# Patient Record
Sex: Female | Born: 1960
Health system: Southern US, Community
[De-identification: ages and names within clinical notes are randomized; demographics above are authoritative.]

## PROBLEM LIST (undated history)

## (undated) ENCOUNTER — Ambulatory Visit (HOSPITAL_BASED_OUTPATIENT_CLINIC_OR_DEPARTMENT_OTHER): Admission: EM | Payer: BC Managed Care – PPO | Source: Home / Self Care

## (undated) DIAGNOSIS — M2550 Pain in unspecified joint: Secondary | ICD-10-CM

## (undated) DIAGNOSIS — C801 Malignant (primary) neoplasm, unspecified: Secondary | ICD-10-CM

## (undated) DIAGNOSIS — J309 Allergic rhinitis, unspecified: Secondary | ICD-10-CM

## (undated) DIAGNOSIS — F419 Anxiety disorder, unspecified: Secondary | ICD-10-CM

## (undated) DIAGNOSIS — R7989 Other specified abnormal findings of blood chemistry: Secondary | ICD-10-CM

## (undated) DIAGNOSIS — R002 Palpitations: Secondary | ICD-10-CM

## (undated) DIAGNOSIS — K644 Residual hemorrhoidal skin tags: Secondary | ICD-10-CM

## (undated) DIAGNOSIS — G4700 Insomnia, unspecified: Secondary | ICD-10-CM

## (undated) DIAGNOSIS — E785 Hyperlipidemia, unspecified: Secondary | ICD-10-CM

## (undated) DIAGNOSIS — D649 Anemia, unspecified: Secondary | ICD-10-CM

## (undated) DIAGNOSIS — R748 Abnormal levels of other serum enzymes: Secondary | ICD-10-CM

## (undated) DIAGNOSIS — M810 Age-related osteoporosis without current pathological fracture: Secondary | ICD-10-CM

## (undated) HISTORY — DX: Palpitations: R00.2

## (undated) HISTORY — PX: UMBILICAL HERNIA REPAIR: SHX196

## (undated) HISTORY — DX: Pain in unspecified joint: M25.50

## (undated) HISTORY — PX: MASTECTOMY: SHX3

## (undated) HISTORY — PX: APPENDECTOMY: SHX54

## (undated) HISTORY — DX: Malignant (primary) neoplasm, unspecified: C80.1

## (undated) HISTORY — DX: Anemia, unspecified: D64.9

## (undated) HISTORY — PX: PARTIAL MASTECTOMY WITH AXILLARY SENTINEL LYMPH NODE BIOPSY: SHX6004

## (undated) HISTORY — DX: Age-related osteoporosis without current pathological fracture: M81.0

## (undated) HISTORY — DX: Abnormal levels of other serum enzymes: R74.8

## (undated) HISTORY — DX: Allergic rhinitis, unspecified: J30.9

## (undated) HISTORY — DX: Hyperlipidemia, unspecified: E78.5

## (undated) HISTORY — PX: TUBAL LIGATION: SHX77

## (undated) HISTORY — DX: Residual hemorrhoidal skin tags: K64.4

## (undated) HISTORY — DX: Insomnia, unspecified: G47.00

## (undated) HISTORY — PX: VAGINAL HYSTERECTOMY: SUR661

## (undated) HISTORY — DX: Anxiety disorder, unspecified: F41.9

## (undated) HISTORY — DX: Other specified abnormal findings of blood chemistry: R79.89

---

## 2007-07-05 ENCOUNTER — Encounter: Admission: RE | Admit: 2007-07-05 | Discharge: 2007-07-05 | Payer: Self-pay | Admitting: Obstetrics and Gynecology

## 2007-07-09 ENCOUNTER — Encounter (INDEPENDENT_AMBULATORY_CARE_PROVIDER_SITE_OTHER): Payer: Self-pay | Admitting: Diagnostic Radiology

## 2007-07-09 ENCOUNTER — Encounter: Admission: RE | Admit: 2007-07-09 | Discharge: 2007-07-09 | Payer: Self-pay | Admitting: Obstetrics and Gynecology

## 2007-07-25 ENCOUNTER — Encounter: Admission: RE | Admit: 2007-07-25 | Discharge: 2007-07-25 | Payer: Self-pay | Admitting: Vascular Surgery

## 2007-07-25 ENCOUNTER — Encounter (INDEPENDENT_AMBULATORY_CARE_PROVIDER_SITE_OTHER): Payer: Self-pay | Admitting: Diagnostic Radiology

## 2010-07-24 ENCOUNTER — Encounter: Payer: Self-pay | Admitting: Vascular Surgery

## 2011-10-24 HISTORY — PX: VAGINAL HYSTERECTOMY: SUR661

## 2015-03-01 ENCOUNTER — Other Ambulatory Visit: Payer: Self-pay

## 2015-03-01 HISTORY — PX: COLONOSCOPY: SHX174

## 2015-09-10 DIAGNOSIS — M8580 Other specified disorders of bone density and structure, unspecified site: Secondary | ICD-10-CM | POA: Diagnosis not present

## 2015-09-10 DIAGNOSIS — C50919 Malignant neoplasm of unspecified site of unspecified female breast: Secondary | ICD-10-CM | POA: Diagnosis not present

## 2015-09-10 DIAGNOSIS — Z79811 Long term (current) use of aromatase inhibitors: Secondary | ICD-10-CM | POA: Diagnosis not present

## 2015-10-08 ENCOUNTER — Encounter (HOSPITAL_COMMUNITY): Payer: Self-pay

## 2016-08-01 DIAGNOSIS — C50412 Malignant neoplasm of upper-outer quadrant of left female breast: Secondary | ICD-10-CM

## 2016-08-01 DIAGNOSIS — Z170 Estrogen receptor positive status [ER+]: Secondary | ICD-10-CM

## 2016-08-01 HISTORY — DX: Malignant neoplasm of upper-outer quadrant of left female breast: Z17.0

## 2016-08-01 HISTORY — DX: Estrogen receptor positive status (ER+): C50.412

## 2016-08-31 DIAGNOSIS — C801 Malignant (primary) neoplasm, unspecified: Secondary | ICD-10-CM | POA: Insufficient documentation

## 2016-09-07 DIAGNOSIS — Z853 Personal history of malignant neoplasm of breast: Secondary | ICD-10-CM | POA: Diagnosis not present

## 2016-09-07 DIAGNOSIS — Z170 Estrogen receptor positive status [ER+]: Secondary | ICD-10-CM

## 2016-09-27 ENCOUNTER — Encounter: Payer: Self-pay | Admitting: Student

## 2016-09-27 ENCOUNTER — Ambulatory Visit (INDEPENDENT_AMBULATORY_CARE_PROVIDER_SITE_OTHER): Payer: 59 | Admitting: Student

## 2016-09-27 DIAGNOSIS — M76821 Posterior tibial tendinitis, right leg: Secondary | ICD-10-CM | POA: Insufficient documentation

## 2016-09-27 HISTORY — DX: Posterior tibial tendinitis, right leg: M76.821

## 2016-09-27 NOTE — Progress Notes (Signed)
Katelyn Cruz - 56 y.o. female MRN [id number]  Date of birth: Apr 10, 1961  SUBJECTIVE:  Including CC & ROS.  CC: right foot pain  Presents with right foot pain that is been ongoing for the past few weeks. She is an avid runner and was noticing pain on her right foot when she was running mainly. Became worse over the past couple weeks. She has been resting it with some relief. She was seen at [facility] diagnosed her with posterior tibialis tendinitis. Updated x-rays which were negative. She is here to be fit for custom orthotics.  She is not currently training for any rays but she would like to get back into recreational running. She has not run for the past week since she last saw Dr. French Ana at [facility].    ROS: No unexpected weight loss, fever, chills, swelling, instability, muscle pain, numbness/tingling, redness, otherwise see HPI   PMHx - Updated and reviewed.  Contributory factors include: Negative PSHx - Updated and reviewed.  Contributory factors include:  Negative FHx - Updated and reviewed.  Contributory factors include:  Negative Social Hx - Updated and reviewed. Contributory factors include: Negative Medications - reviewed   DATA REVIEWED: 3 view right foot x-ray which was negative done on 09/19/2016  PHYSICAL EXAM:  VS: BP:111/71  HR: bpm  TEMP: ( )  RESP:   HT:5\' 2"  (157.5 cm)   WT:110 lb (49.9 kg)  BMI:20.2 PHYSICAL EXAM: Gen: NAD, alert, cooperative with exam, well-appearing HEENT: clear conjunctiva,  CV:  no edema, capillary refill brisk, normal rate Resp: non-labored Skin: no rashes, normal turgor  Neuro: no gross deficits.  Psych:  alert and oriented  Ankle & Foot: No visible swelling, ecchymosis, erythema, ulcers, calluses, blister Arch: Normal w/o pes cavus or planus, but fallen arches upon standing  Right posterior tibialis tenderness to palpation none over left. Tenderness to palpation along the medial tibia 6 cm superior to distal end b/l bunion  formation Achilles tendon without nodules or tenderness No swelling of retrocalcaneal bursa No pain at MT heads No pain at base of 5th MT; No tenderness over cuboid; No tenderness over N spot or navicular prominence No sign of peroneal tendon subluxations or tenderness to palpation Full in plantarflexion, dorsiflexion, inversion, and eversion of the foot; flexion and extension of the toes Strength: 5/5 in all directions. Sensation: intact Vascular: intact w/ dorsalis pedis & posterior tibialis pulses 2+ Stable lateral and medial ligaments; Negative Anterior drawer test  Gait  Overall balanced gait with appropriate base width and stride length  Foot: supination R>L; No in-toeing or out-toeing; No foot slap or high steppage gait  Knee: extension and flexion adequate w/o genu varus or valgus  Hip: No circumduction or contralateral drop  Trunk: Neutral w/o lean  Shoe evaluation: wearing of b/l 5th ray and 1st MTP     ASSESSMENT & PLAN:   Tibialis posterior tendinitis, right Did custom orthotics to help with her tendinitis. Arch straps to help with swelling and arch support. She'll follow-up if she needs further padding for her orthotics. She'll gradually return to running after she has had a period of rest.  Patient was fitted for a standard, cushioned, semi-rigid orthotic. The orthotic was heated and afterward the patient stood on the orthotic blank positioned on the orthotic stand. The patient was positioned in subtalar neutral position and 10 degrees of ankle dorsiflexion in a weight bearing stance. After completion of molding, a stable base was applied to the orthotic blank. The blank  was ground to a stable position for weight bearing. Size: 6 Base: Blue EVA Additional Posting and Padding: None The patient ambulated these, and they were very comfortable.  I spent 45 minutes with this patient, greater than 50% was face-to-face time counseling regarding the below  diagnosis.

## 2016-09-27 NOTE — Assessment & Plan Note (Addendum)
Did custom orthotics to help with her tendinitis. Arch straps to help with swelling and arch support. She'll follow-up if she needs further padding for her orthotics. She'll gradually return to running after she has had a period of rest.

## 2017-09-07 DIAGNOSIS — Z853 Personal history of malignant neoplasm of breast: Secondary | ICD-10-CM | POA: Diagnosis not present

## 2017-12-25 ENCOUNTER — Encounter: Payer: Self-pay | Admitting: Gastroenterology

## 2017-12-31 ENCOUNTER — Encounter: Payer: Self-pay | Admitting: Gastroenterology

## 2018-02-14 ENCOUNTER — Ambulatory Visit (AMBULATORY_SURGERY_CENTER): Payer: Self-pay | Admitting: *Deleted

## 2018-02-14 VITALS — Ht 62.0 in | Wt 111.4 lb

## 2018-02-14 DIAGNOSIS — Z86010 Personal history of colonic polyps: Secondary | ICD-10-CM

## 2018-02-14 MED ORDER — SUPREP BOWEL PREP KIT 17.5-3.13-1.6 GM/177ML PO SOLN: 1.0000 | Freq: Once | ORAL | 0 refills | Status: AC

## 2018-02-14 NOTE — Progress Notes (Signed)
No egg or soy allergy known to patient  No issues with past sedation with any surgeries  or procedures, no intubation problems  No diet pills per patient No home 02 use per patient  No blood thinners per patient  Pt denies issues with constipation  No A fib or A flutter  EMMI video offered

## 2018-02-15 ENCOUNTER — Telehealth: Payer: Self-pay | Admitting: Gastroenterology

## 2018-02-15 ENCOUNTER — Encounter: Payer: Self-pay | Admitting: Gastroenterology

## 2018-02-18 MED ORDER — SOD PICOSULFATE-MAG OX-CIT ACD 10-3.5-12 MG-GM -GM/160ML PO SOLN: 1.0000 | ORAL | 0 refills | Status: DC

## 2018-02-18 NOTE — Telephone Encounter (Signed)
Pt states the pharmacy spoke with someone on Friday and has Clenpiq ready for her to pick up- I did resend Clenpiq to the pharmacy in case-- Lelan Pons PV

## 2018-02-22 ENCOUNTER — Ambulatory Visit (AMBULATORY_SURGERY_CENTER): Payer: 59 | Admitting: Gastroenterology

## 2018-02-22 ENCOUNTER — Encounter: Payer: Self-pay | Admitting: Gastroenterology

## 2018-02-22 VITALS — BP 122/55 | HR 63 | Temp 97.7°F | Resp 14 | Ht 62.0 in | Wt 111.0 lb

## 2018-02-22 DIAGNOSIS — Z86010 Personal history of colonic polyps: Secondary | ICD-10-CM

## 2018-02-22 DIAGNOSIS — R197 Diarrhea, unspecified: Secondary | ICD-10-CM

## 2018-02-22 MED ORDER — SODIUM CHLORIDE 0.9 % IV SOLN: 500.0000 mL | Freq: Once | INTRAVENOUS | Status: DC

## 2018-02-22 NOTE — Op Note (Signed)
[facility] Patient Name: Katelyn Cruz Procedure Date: 02/22/2018 8:59 AM MRN: [id number] Endoscopist: Jackquline Denmark , MD Age: 57 Referring MD:  Date of Birth: 14-Jan-1961 Gender: Female Account #: [id number] Procedure:                Colonoscopy Indications:              High risk colon cancer surveillance: Personal                            history of colonic polyps, History of diarrhea Medicines:                Monitored Anesthesia Care Procedure:                Pre-Anesthesia Assessment:                           - Prior to the procedure, a History and Physical                            was performed, and patient medications and                            allergies were reviewed. The patient's tolerance of                            previous anesthesia was also reviewed. The risks                            and benefits of the procedure and the sedation                            options and risks were discussed with the patient.                            All questions were answered, and informed consent                            was obtained. Prior Anticoagulants: The patient has                            taken no previous anticoagulant or antiplatelet                            agents. ASA Grade Assessment: I - A normal, healthy                            patient. After reviewing the risks and benefits,                            the patient was deemed in satisfactory condition to                            undergo the procedure.  After obtaining informed consent, the colonoscope                            was passed under direct vision. Throughout the                            procedure, the patient's blood pressure, pulse, and                            oxygen saturations were monitored continuously. The                            Colonoscope was introduced through the anus and                            advanced to the 2 cm into the  ileum. The                            colonoscopy was performed without difficulty. The                            patient tolerated the procedure well. The quality                            of the bowel preparation was adequate to identify                            polyps 6 mm and larger in size. Some retained stool                            and solid vegetable material. Overall, 90-95% or                            colonic mucosa was visualized satisfactorily. Scope In: 9:08:48 AM Scope Out: 9:23:00 AM Scope Withdrawal Time: 0 hours 7 minutes 25 seconds  Total Procedure Duration: 0 hours 14 minutes 12 seconds  Findings:                 Non-bleeding internal hemorrhoids were found. The                            hemorrhoids were small. Biopsies for histology were                            taken with a cold forceps from the entire colon for                            evaluation of microscopic colitis.                           The exam was otherwise without abnormality on                            direct and retroflexion views.  Complications:            No immediate complications. Estimated Blood Loss:     Estimated blood loss: none. Impression:               - Non-bleeding internal hemorrhoids. Biopsied.                           - The examination was otherwise normal on direct                            and retroflexion views. Recommendation:           - Patient has a contact number available for                            emergencies. The signs and symptoms of potential                            delayed complications were discussed with the                            patient. Return to normal activities tomorrow.                            Written discharge instructions were provided to the                            patient.                           - Resume previous diet.                           - Continue present medications.                           - Await pathology  results.                           - Repeat colonoscopy in 5 years for surveillance.                           - Return to GI clinic PRN. Jackquline Denmark, MD 02/22/2018 9:28:27 AM This report has been signed electronically.

## 2018-02-22 NOTE — Progress Notes (Signed)
Pt's states no medical or surgical changes since previsit or office visit. 

## 2018-02-22 NOTE — Progress Notes (Signed)
Called to room to assist during endoscopic procedure.  Patient ID and intended procedure confirmed with present staff. Received instructions for my participation in the procedure from the performing physician.  

## 2018-02-22 NOTE — Patient Instructions (Signed)
Please read handout on Hemorrhoids. Continue present medications. Return to GI clinic as needed.     YOU HAD AN ENDOSCOPIC PROCEDURE TODAY AT [facility]:   Refer to the procedure report that was given to you for any specific questions about what was found during the examination.  If the procedure report does not answer your questions, please call your gastroenterologist to clarify.  If you requested that your care partner not be given the details of your procedure findings, then the procedure report has been included in a sealed envelope for you to review at your convenience later.  YOU SHOULD EXPECT: Some feelings of bloating in the abdomen. Passage of more gas than usual.  Walking can help get rid of the air that was put into your GI tract during the procedure and reduce the bloating. If you had a lower endoscopy (such as a colonoscopy or flexible sigmoidoscopy) you may notice spotting of blood in your stool or on the toilet paper. If you underwent a bowel prep for your procedure, you may not have a normal bowel movement for a few days.  Please Note:  You might notice some irritation and congestion in your nose or some drainage.  This is from the oxygen used during your procedure.  There is no need for concern and it should clear up in a day or so.  SYMPTOMS TO REPORT IMMEDIATELY:   Following lower endoscopy (colonoscopy or flexible sigmoidoscopy):  Excessive amounts of blood in the stool  Significant tenderness or worsening of abdominal pains  Swelling of the abdomen that is new, acute  Fever of 100F or higher    For urgent or emergent issues, a gastroenterologist can be reached at any hour by calling [phone].   DIET:  We do recommend a small meal at first, but then you may proceed to your regular diet.  Drink plenty of fluids but you should avoid alcoholic beverages for 24 hours.  ACTIVITY:  You should plan to take it easy for the rest of today and you  should NOT DRIVE or use heavy machinery until tomorrow (because of the sedation medicines used during the test).    FOLLOW UP: Our staff will call the number listed on your records the next business day following your procedure to check on you and address any questions or concerns that you may have regarding the information given to you following your procedure. If we do not reach you, we will leave a message.  However, if you are feeling well and you are not experiencing any problems, there is no need to return our call.  We will assume that you have returned to your regular daily activities without incident.  If any biopsies were taken you will be contacted by phone or by letter within the next 1-3 weeks.  Please call us at [phone] if you have not heard about the biopsies in 3 weeks.    SIGNATURES/CONFIDENTIALITY: You and/or your care partner have signed paperwork which will be entered into your electronic medical record.  These signatures attest to the fact that that the information above on your After Visit Summary has been reviewed and is understood.  Full responsibility of the confidentiality of this discharge information lies with you and/or your care-partner.

## 2018-02-22 NOTE — Progress Notes (Signed)
Report given to PACU, vss 

## 2018-02-25 ENCOUNTER — Telehealth: Payer: Self-pay

## 2018-02-25 NOTE — Telephone Encounter (Signed)
  Follow up Call-  Call back number 02/22/2018  Post procedure Call Back phone  # [phone]  Permission to leave phone message Yes  Some recent data might be hidden     Patient questions:  Do you have a fever, pain , or abdominal swelling? No. Pain Score  0 *  Have you tolerated food without any problems? Yes.    Have you been able to return to your normal activities? Yes.    Do you have any questions about your discharge instructions: Diet   No. Medications  No. Follow up visit  No.  Do you have questions or concerns about your Care? No.  Actions: * If pain score is 4 or above: No action needed, pain <4.

## 2018-03-07 ENCOUNTER — Telehealth: Payer: Self-pay | Admitting: Gastroenterology

## 2018-03-07 ENCOUNTER — Encounter: Payer: Self-pay | Admitting: Gastroenterology

## 2018-03-07 NOTE — Telephone Encounter (Signed)
Pt calling for biopsy results, please advise.

## 2018-03-11 ENCOUNTER — Telehealth: Payer: Self-pay | Admitting: Gastroenterology

## 2018-03-11 NOTE — Telephone Encounter (Signed)
Results letter discussed with pt and she is aware.

## 2018-04-09 ENCOUNTER — Telehealth: Payer: Self-pay | Admitting: Gastroenterology

## 2018-04-09 NOTE — Telephone Encounter (Signed)
Patient requesting to speak with the nurse regarding results from procedure on 8.23.19. Patient states she has a couple questions about the results.

## 2018-04-09 NOTE — Telephone Encounter (Signed)
Pt states the symptoms she is having are just like the ones she had 3 years ago when she was told she had microscopic colitis. This colon bx was negative for microscopic colitis. She wants to know if perhaps the biopsy just did not contain it, could it be elsewhere in the colon. She is still having multiple diarrhea stools/day. Reports she has already had 6 stools this morning. She has cut out all supplements and wonders if starting the medication she took previously might help. Please advise.

## 2018-04-11 NOTE — Telephone Encounter (Signed)
Can take Imodium AD 1 twice daily Patient to call if she still has diarrhea Do not want to give empiric budesonide at this time Biopsies are fairly good and ruling out microscopic colitis

## 2018-04-11 NOTE — Telephone Encounter (Signed)
Spoke with pt and she is aware.

## 2018-04-15 ENCOUNTER — Telehealth: Payer: Self-pay | Admitting: Gastroenterology

## 2018-04-15 MED ORDER — CHOLESTYRAMINE 4 G PO PACK: 4.0000 g | PACK | Freq: Two times a day (BID) | ORAL | 2 refills | Status: DC

## 2018-04-15 NOTE — Telephone Encounter (Signed)
Lets do cholestyramine 4 g p.o. twice daily, 2 hours before or after rest of the medications. #30 with 2 refills Let us know in 2 weeks

## 2018-04-15 NOTE — Telephone Encounter (Signed)
Pt called stating that her pharmacy does not have cholestyramine available, pt wants prescription [facility] to [facility] [address] in [place].

## 2018-04-15 NOTE — Telephone Encounter (Signed)
Spoke with pt and she is aware, script sent to pharmacy. 

## 2018-04-15 NOTE — Telephone Encounter (Signed)
Sent refills to pharmacy that patient requested.

## 2018-04-15 NOTE — Telephone Encounter (Signed)
Pt states she has been taking Imodium BID but it works hit or miss. She has done some research and wants to know if Dr. Lyndel Safe would prescribe cholestyramine for her to see if this works. Please advise.

## 2018-07-22 DIAGNOSIS — Z1231 Encounter for screening mammogram for malignant neoplasm of breast: Secondary | ICD-10-CM | POA: Diagnosis not present

## 2018-07-22 DIAGNOSIS — M8588 Other specified disorders of bone density and structure, other site: Secondary | ICD-10-CM | POA: Diagnosis not present

## 2018-07-22 DIAGNOSIS — M8589 Other specified disorders of bone density and structure, multiple sites: Secondary | ICD-10-CM | POA: Diagnosis not present

## 2018-07-22 DIAGNOSIS — M47816 Spondylosis without myelopathy or radiculopathy, lumbar region: Secondary | ICD-10-CM | POA: Diagnosis not present

## 2018-08-04 DIAGNOSIS — K449 Diaphragmatic hernia without obstruction or gangrene: Secondary | ICD-10-CM | POA: Diagnosis not present

## 2018-08-04 DIAGNOSIS — R1013 Epigastric pain: Secondary | ICD-10-CM | POA: Diagnosis not present

## 2018-08-04 DIAGNOSIS — K8020 Calculus of gallbladder without cholecystitis without obstruction: Secondary | ICD-10-CM | POA: Diagnosis not present

## 2018-08-04 DIAGNOSIS — Z79899 Other long term (current) drug therapy: Secondary | ICD-10-CM | POA: Diagnosis not present

## 2018-08-04 DIAGNOSIS — R079 Chest pain, unspecified: Secondary | ICD-10-CM | POA: Diagnosis not present

## 2018-08-05 DIAGNOSIS — K449 Diaphragmatic hernia without obstruction or gangrene: Secondary | ICD-10-CM | POA: Diagnosis not present

## 2018-08-05 DIAGNOSIS — K8020 Calculus of gallbladder without cholecystitis without obstruction: Secondary | ICD-10-CM | POA: Diagnosis not present

## 2018-08-21 DIAGNOSIS — K529 Noninfective gastroenteritis and colitis, unspecified: Secondary | ICD-10-CM | POA: Diagnosis not present

## 2018-08-21 DIAGNOSIS — K8020 Calculus of gallbladder without cholecystitis without obstruction: Secondary | ICD-10-CM | POA: Diagnosis not present

## 2018-08-27 DIAGNOSIS — R1013 Epigastric pain: Secondary | ICD-10-CM

## 2018-08-27 DIAGNOSIS — K8010 Calculus of gallbladder with chronic cholecystitis without obstruction: Secondary | ICD-10-CM

## 2018-08-27 DIAGNOSIS — R945 Abnormal results of liver function studies: Secondary | ICD-10-CM | POA: Diagnosis not present

## 2018-08-27 DIAGNOSIS — R7989 Other specified abnormal findings of blood chemistry: Secondary | ICD-10-CM | POA: Insufficient documentation

## 2018-08-27 HISTORY — DX: Calculus of gallbladder with chronic cholecystitis without obstruction: K80.10

## 2018-08-27 HISTORY — DX: Epigastric pain: R10.13

## 2018-08-27 HISTORY — DX: Other specified abnormal findings of blood chemistry: R79.89

## 2018-10-24 DIAGNOSIS — F329 Major depressive disorder, single episode, unspecified: Secondary | ICD-10-CM | POA: Diagnosis not present

## 2018-10-24 DIAGNOSIS — G4700 Insomnia, unspecified: Secondary | ICD-10-CM | POA: Diagnosis not present

## 2018-10-24 DIAGNOSIS — M810 Age-related osteoporosis without current pathological fracture: Secondary | ICD-10-CM | POA: Diagnosis not present

## 2018-10-24 DIAGNOSIS — R748 Abnormal levels of other serum enzymes: Secondary | ICD-10-CM | POA: Diagnosis not present

## 2018-10-29 DIAGNOSIS — R1013 Epigastric pain: Secondary | ICD-10-CM | POA: Diagnosis not present

## 2018-10-29 DIAGNOSIS — R7989 Other specified abnormal findings of blood chemistry: Secondary | ICD-10-CM | POA: Diagnosis not present

## 2018-10-29 DIAGNOSIS — K8010 Calculus of gallbladder with chronic cholecystitis without obstruction: Secondary | ICD-10-CM | POA: Diagnosis not present

## 2018-11-01 HISTORY — PX: CHOLECYSTECTOMY: SHX55

## 2018-11-06 DIAGNOSIS — K915 Postcholecystectomy syndrome: Secondary | ICD-10-CM | POA: Diagnosis not present

## 2018-11-06 DIAGNOSIS — K8010 Calculus of gallbladder with chronic cholecystitis without obstruction: Secondary | ICD-10-CM | POA: Diagnosis not present

## 2018-11-06 DIAGNOSIS — R7989 Other specified abnormal findings of blood chemistry: Secondary | ICD-10-CM | POA: Diagnosis not present

## 2018-11-06 DIAGNOSIS — J309 Allergic rhinitis, unspecified: Secondary | ICD-10-CM | POA: Diagnosis not present

## 2018-11-06 DIAGNOSIS — K810 Acute cholecystitis: Secondary | ICD-10-CM | POA: Diagnosis not present

## 2018-11-12 DIAGNOSIS — Z09 Encounter for follow-up examination after completed treatment for conditions other than malignant neoplasm: Secondary | ICD-10-CM

## 2018-11-12 HISTORY — DX: Encounter for follow-up examination after completed treatment for conditions other than malignant neoplasm: Z09

## 2018-11-19 DIAGNOSIS — L814 Other melanin hyperpigmentation: Secondary | ICD-10-CM | POA: Diagnosis not present

## 2018-11-19 DIAGNOSIS — D2239 Melanocytic nevi of other parts of face: Secondary | ICD-10-CM | POA: Diagnosis not present

## 2018-11-19 DIAGNOSIS — L821 Other seborrheic keratosis: Secondary | ICD-10-CM | POA: Diagnosis not present

## 2018-11-19 DIAGNOSIS — D225 Melanocytic nevi of trunk: Secondary | ICD-10-CM | POA: Diagnosis not present

## 2018-12-06 DIAGNOSIS — M25551 Pain in right hip: Secondary | ICD-10-CM | POA: Diagnosis not present

## 2018-12-12 DIAGNOSIS — M25551 Pain in right hip: Secondary | ICD-10-CM | POA: Diagnosis not present

## 2019-03-25 DIAGNOSIS — R197 Diarrhea, unspecified: Secondary | ICD-10-CM | POA: Diagnosis not present

## 2019-03-25 DIAGNOSIS — G4700 Insomnia, unspecified: Secondary | ICD-10-CM | POA: Diagnosis not present

## 2019-03-25 DIAGNOSIS — E559 Vitamin D deficiency, unspecified: Secondary | ICD-10-CM | POA: Diagnosis not present

## 2019-03-25 DIAGNOSIS — R748 Abnormal levels of other serum enzymes: Secondary | ICD-10-CM | POA: Diagnosis not present

## 2019-06-23 DIAGNOSIS — R197 Diarrhea, unspecified: Secondary | ICD-10-CM | POA: Diagnosis not present

## 2019-06-30 DIAGNOSIS — L299 Pruritus, unspecified: Secondary | ICD-10-CM | POA: Diagnosis not present

## 2019-06-30 DIAGNOSIS — L209 Atopic dermatitis, unspecified: Secondary | ICD-10-CM | POA: Diagnosis not present

## 2019-07-08 ENCOUNTER — Telehealth (INDEPENDENT_AMBULATORY_CARE_PROVIDER_SITE_OTHER): Payer: BC Managed Care – PPO | Admitting: Gastroenterology

## 2019-07-08 ENCOUNTER — Other Ambulatory Visit: Payer: Self-pay

## 2019-07-08 VITALS — Ht 62.0 in | Wt 105.0 lb

## 2019-07-08 DIAGNOSIS — Z681 Body mass index (BMI) 19 or less, adult: Secondary | ICD-10-CM | POA: Diagnosis not present

## 2019-07-08 DIAGNOSIS — R634 Abnormal weight loss: Secondary | ICD-10-CM | POA: Diagnosis not present

## 2019-07-08 DIAGNOSIS — R197 Diarrhea, unspecified: Secondary | ICD-10-CM | POA: Diagnosis not present

## 2019-07-08 NOTE — Progress Notes (Signed)
Chief Complaint:   Referring Provider:  Melony Overly, MD      ASSESSMENT AND PLAN;   #1.  Diarrhea.  Likely due to IBS, element of post-chole diarrhea. R/O other causes. Neg colon with random colonic bx for microscopic colitis 01/2018.  However, previously in 2016, biopsies were positive for microscopic colitis.  Failed cholestyamine and equalactin. Failed lomotil. Neg C. Diff.  #2.  Weight loss  Plan: -Obtain previous blood tests from Orlinda Blalock FNP especially TSH -Stool studies for GI Pathogen (including giardia), fecal elastase, fat and Calprotectin.  Can be done at [facility]. -Stop all meds except calcium/vitamin D.  Add 1 medicine at a time, starting with digestive enzymes with each meals in 1 week. -Call us in 2 weeks.  Encouraged her to keep a diary. -FU 6 weeks.  If everything fails, would give her a trial of Lialda (has previous history of microscopic colitis in 2016, did very well with Lialda) -Check Wt every week and record. If continued wt loss, will proceed with CT abdo/pelvis with PO/IV contrast.  -Continue gluten-free diet for now.    HPI:    Katelyn Cruz is a 59 y.o. female   Has been having diarrhea since Aug 2019.  Was given trial of gluten-free diet which helped her a lot.  Thereafter underwent laparoscopic cholecystectomy in May 2020.  The diarrhea did get worse.  She was given trial of Equalactin, cholestyramine, Lomotil and Imodium without any significant relief.  Describes diarrhea as loose bowel movements, " oatmeal consistency" 5-7/day.  Did have some nocturnal symptoms which are better now.  She does feel somewhat better over the last 2 to 3 weeks.  Denies having any significant abdominal pain but does get bloating.  Had negative stool studies including C. difficile per patient.  We do not have any records currently.  Also tried lactose-free diet which helped a little.  Has been using digestive enzymes with each meals which helps a little  as well.  Runs a lot  Lost wt 5-6lb since may 2020  No melena or hematochezia.  No fever chills or night sweats.  No nausea, vomiting, heartburn, regurgitation odynophagia or dysphagia.   Past Medical History:  Diagnosis Date  . Allergic rhinitis   . Anxiety   . Arthralgia of multiple sites   . Cancer [facility])    Left Breast cancer, 2010  . Elevated liver enzymes   . External hemorrhoids   . Hyperlipidemia   . Insomnia   . Low vitamin D level   . Osteoporosis   . Palpitations     Past Surgical History:  Procedure Laterality Date  . APPENDECTOMY    . CHOLECYSTECTOMY  11/2018   [facility]. Dr Noberto Retort  . COLONOSCOPY  03/01/2015   Colonic polyps status post polypectomy. Internal and external hemorrhoids  . PARTIAL MASTECTOMY WITH AXILLARY SENTINEL LYMPH NODE BIOPSY    . TUBAL LIGATION    . VAGINAL HYSTERECTOMY      Family History  Problem Relation Age of Onset  . Colon cancer Neg Hx   . Colon polyps Neg Hx   . Esophageal cancer Neg Hx   . Stomach cancer Neg Hx   . Rectal cancer Neg Hx   . Pancreatic cancer Neg Hx     Social History   Tobacco Use  . Smoking status: Never Smoker  . Smokeless tobacco: Never Used  Substance Use Topics  . Alcohol use: Not Currently  . Drug use: Never  Current Outpatient Medications  Medication Sig Dispense Refill  . alendronate (FOSAMAX) 70 MG tablet TAKE 1 TABLET BY MOUTH ONCE (1) A WEEK  0  . Cholecalciferol (VITAMIN D PO) Take 1 tablet by mouth daily.     . Coenzyme Q10 200 MG TABS Take 1 tablet by mouth daily.    . NON FORMULARY Tumeric Curcumin 500mg  with 5 mg Bioperine    . Nutritional Supplements (JUICE PLUS FIBRE PO) Take 4 tablets by mouth daily.     Marland Kitchen OVER THE COUNTER MEDICATION daily. Vit d plus K2-1 drop    . UNABLE TO FIND 1 tablet 2 (two) times daily. calcuim carbonate 800 mg plus d3    . zaleplon (SONATA) 5 MG capsule TAKE 1 CAPSULE BY MOUTH AS NEEDED FOR SLEEP     No current facility-administered  medications for this visit.    No Known Allergies  Review of Systems:  Constitutional: Denies fever, chills, diaphoresis, appetite change and fatigue.  HEENT: Denies photophobia, eye pain, redness, hearing loss, ear pain, congestion, sore throat, rhinorrhea, sneezing, mouth sores, neck pain, neck stiffness and tinnitus.   Respiratory: Denies SOB, DOE, cough, chest tightness,  and wheezing.   Cardiovascular: Denies chest pain, palpitations and leg swelling.  Genitourinary: Denies dysuria, urgency, frequency, hematuria, flank pain and difficulty urinating.  Musculoskeletal: Denies myalgias, back pain, joint swelling, arthralgias and gait problem.  Skin: No rash.  Neurological: Denies dizziness, seizures, syncope, weakness, light-headedness, numbness and headaches.  Hematological: Denies adenopathy. Easy bruising, personal or family bleeding history  Psychiatric/Behavioral: Has some anxiety, no depression.     Physical Exam:    Ht 5\' 2"  (1.575 m)   Wt 105 lb (47.6 kg)   BMI 19.20 kg/m  Wt Readings from Last 3 Encounters:  07/08/19 105 lb (47.6 kg)  02/22/18 111 lb (50.3 kg)  02/14/18 111 lb 6.4 oz (50.5 kg)  Televisit.  I connected with  Katelyn Cruz on 07/08/19 by a video enabled telemedicine application and verified that I am speaking with the correct person using two identifiers.   I discussed the limitations of evaluation and management by telemedicine. The patient expressed understanding and agreed to proceed.  Time spent on coordination of care/phone call: 25 min   Carmell Austria, MD 07/08/2019, 10:45 AM  Cc: Melony Overly, MD

## 2019-07-08 NOTE — Patient Instructions (Addendum)
If you are age 58 or older, your body mass index should be between 23-30. Your Body mass index is 19.2 kg/m. If this is out of the aforementioned range listed, please consider follow up with your Primary Care Provider.  If you are age 20 or younger, your body mass index should be between 19-25. Your Body mass index is 19.2 kg/m. If this is out of the aformentioned range listed, please consider follow up with your Primary Care Provider.   Please go to the lab at [facility] ([place] You will need to go to level "B", you do not need an appointment for this. Hours available are 7:30 am - 4:30 pm.   Stop all meds except calcium/vitamin D.  Add 1 medicine at a time, starting with digestive enzymes with each meals in 1 week.  Keep a food diary.  Please call Dr. Leland Her nurse Karl Pock, RN)  in 2 weeks at [phone]  to let her now how you are doing.   Check weight and record every week.   Follow up in 6 weeks.   Thank you,  Dr. Jackquline Denmark

## 2019-07-11 ENCOUNTER — Telehealth: Payer: Self-pay | Admitting: Gastroenterology

## 2019-07-11 NOTE — Telephone Encounter (Signed)
Pt called inquiring about some stool studies that Dr. Lyndel Safe wants pt to have done. Pt states that Dr. Lyndel Safe told her that somebody from the office would call her to coordinate having tests in [place] where she lives but she has not heard from anyone. Pls call her.

## 2019-07-11 NOTE — Telephone Encounter (Signed)
Katelyn Cruz,  Please arrange for this patient to have the labs at [facility] in [place] please-per the patient's request

## 2019-07-15 NOTE — Telephone Encounter (Signed)
Patient has requested to have orders faxed to [facility]. I have faxed orders to [facility].

## 2019-07-15 NOTE — Telephone Encounter (Signed)
I have called patient and left message for her to return my call.  

## 2019-07-21 ENCOUNTER — Encounter: Payer: Self-pay | Admitting: Gastroenterology

## 2019-07-21 DIAGNOSIS — R197 Diarrhea, unspecified: Secondary | ICD-10-CM | POA: Diagnosis not present

## 2019-07-21 LAB — POCT STOOL FOR WBC

## 2019-07-24 DIAGNOSIS — Z1231 Encounter for screening mammogram for malignant neoplasm of breast: Secondary | ICD-10-CM | POA: Diagnosis not present

## 2019-07-28 NOTE — Telephone Encounter (Signed)
Patient is returning your call.  

## 2019-07-29 DIAGNOSIS — G4700 Insomnia, unspecified: Secondary | ICD-10-CM | POA: Diagnosis not present

## 2019-07-29 DIAGNOSIS — F419 Anxiety disorder, unspecified: Secondary | ICD-10-CM | POA: Diagnosis not present

## 2019-08-04 ENCOUNTER — Telehealth: Payer: Self-pay | Admitting: Gastroenterology

## 2019-08-04 NOTE — Telephone Encounter (Signed)
Patient returned call to the office-patient informed that the results would have to be requested from [facility] and then Dr. Lyndel Safe would review them and then she would receive a return phone call with an update;  Larena Glassman, Please request these results and place on Dr. Steve Rattler desk for review  Dr. Adonis Brook review results and advise on patient's plan of care

## 2019-08-04 NOTE — Telephone Encounter (Signed)
Dr. Lyndel Safe reviewed these labs and they were normal. Patient was notified. Patient is still complaining of diarrhea and wants to know what she needs to do next?

## 2019-08-05 NOTE — Telephone Encounter (Signed)
I have reviewed her medications Lets stop all medications including Juice Plus x 2 weeks. Can use Imodium AD 1 tablet p.o. twice daily over-the-counter. Let us know how she is in 2 weeks.  Then we can start adding 1 at a time  RG

## 2019-08-05 NOTE — Telephone Encounter (Signed)
Patient says she did stop all her medications with no relief.

## 2019-08-06 DIAGNOSIS — R928 Other abnormal and inconclusive findings on diagnostic imaging of breast: Secondary | ICD-10-CM | POA: Diagnosis not present

## 2019-08-06 DIAGNOSIS — R922 Inconclusive mammogram: Secondary | ICD-10-CM | POA: Diagnosis not present

## 2019-08-06 MED ORDER — MESALAMINE 1.2 G PO TBEC: 2.4000 g | DELAYED_RELEASE_TABLET | Freq: Every day | ORAL | 0 refills | Status: DC

## 2019-08-06 NOTE — Telephone Encounter (Signed)
She did very well with Lialda previously (previous biopsies did show microscopic colitis)  Lets try Lialda 2.4 g p.o. once a day or mesalamine 2.4 g p.o. once a day x 4 weeks. I think she will get better Follow-up in 4 weeks with me or Colleen.  In person Also, can be get stool studies results and scan into the chart RG

## 2019-08-06 NOTE — Telephone Encounter (Signed)
I have sent prescription in to patients pharmacy. Patient has requested that her follow up visit be virtual since she works out of town but appointment has been made.

## 2019-09-01 ENCOUNTER — Encounter: Payer: Self-pay | Admitting: Gastroenterology

## 2019-09-01 ENCOUNTER — Other Ambulatory Visit: Payer: Self-pay

## 2019-09-01 ENCOUNTER — Telehealth (INDEPENDENT_AMBULATORY_CARE_PROVIDER_SITE_OTHER): Payer: BC Managed Care – PPO | Admitting: Gastroenterology

## 2019-09-01 VITALS — Ht 62.0 in | Wt 108.0 lb

## 2019-09-01 DIAGNOSIS — R197 Diarrhea, unspecified: Secondary | ICD-10-CM

## 2019-09-01 MED ORDER — MESALAMINE ER 0.375 G PO CP24: 1500.0000 mg | ORAL_CAPSULE | Freq: Every day | ORAL | 6 refills | Status: DC

## 2019-09-01 NOTE — Progress Notes (Signed)
Chief Complaint: FU  Referring Provider:  Melony Overly, MD      ASSESSMENT AND PLAN;   #1.  Diarrhea.  Likely d/t microscopic colitis.  D/d IBS, post-chole diarrhea. R/O other causes. Neg colon with random colonic bx for microscopic colitis 01/2018.  However, previously in 2016, biopsies were positive for microscopic colitis.  Failed cholestyamine and equalactin. Failed lomotil. Neg stool studies including C. Diff, WBCs, culture, fecal elastase.  Responded very well to mesalamine in the past.  #2.  Weight loss (resolved)  Plan: -Currently taking mesalamine 1.2 g 2/day.  Was very expensive.  We will try to switch her to Apriso 0.375 4/day and get cards/coupons. -Continue calcium plus vitamin D -Follow-up in 12 weeks.  At follow-up, wean her off. -She would like to hold off on any further evaluation since she feels significantly better. -Avoid nonsteroidals. -Continue gluten-free and lactose-free diet for now as before.      HPI:    Katelyn Cruz is a 59 y.o. female  For follow-up visit Doing much better Mesalamine took about 2 weeks to work.  Cost was very high. Diarrhea has almost completely resolved. No further weight loss. Good appetite.  She has been running.  No nonsteroidals.  Has not added any other nutritional supplements.  Got first dose of COVID-19 Moderna last week.     From past note:  diarrhea since Aug 2019. 5-7 BMs/day gluten-free diet which helped her a lot.  laparoscopic cholecystectomy in May 2020.  Failed Equalactin, cholestyramine, Lomotil and Imodium without any significant relief. No abdominal pain but does get bloating. negative stool studies including C. difficile per patient.  We do not have any records currently. Also tried lactose-free diet which helped a little.  Has been using digestive enzymes with each meals which helps a little as well. Blood work from [facility] was negative including TSH.  We still do not have  records.   Past Medical History:  Diagnosis Date  . Allergic rhinitis   . Anxiety   . Arthralgia of multiple sites   . Cancer [facility])    Left Breast cancer, 2010  . Elevated liver enzymes   . External hemorrhoids   . Hyperlipidemia   . Insomnia   . Low vitamin D level   . Osteoporosis   . Palpitations     Past Surgical History:  Procedure Laterality Date  . APPENDECTOMY    . CHOLECYSTECTOMY  11/2018   [facility]. Dr Noberto Retort  . COLONOSCOPY  03/01/2015   Colonic polyps status post polypectomy. Internal and external hemorrhoids  . PARTIAL MASTECTOMY WITH AXILLARY SENTINEL LYMPH NODE BIOPSY    . TUBAL LIGATION    . VAGINAL HYSTERECTOMY      Family History  Problem Relation Age of Onset  . Colon cancer Neg Hx   . Colon polyps Neg Hx   . Esophageal cancer Neg Hx   . Stomach cancer Neg Hx   . Rectal cancer Neg Hx   . Pancreatic cancer Neg Hx     Social History   Tobacco Use  . Smoking status: Never Smoker  . Smokeless tobacco: Never Used  Substance Use Topics  . Alcohol use: Not Currently  . Drug use: Never    Current Outpatient Medications  Medication Sig Dispense Refill  . alendronate (FOSAMAX) 70 MG tablet TAKE 1 TABLET BY MOUTH ONCE (1) A WEEK  0  . Cholecalciferol (VITAMIN D PO) Take 1 tablet by mouth daily.     Marland Kitchen  Digestive Enzymes (DIGESTIVE ENZYME PO) Take 1 tablet by mouth as directed. Take 1 tablet with each meal    . mesalamine (LIALDA) 1.2 g EC tablet Take 2 tablets (2.4 g total) by mouth daily with breakfast. 60 tablet 0  . UNABLE TO FIND 1 tablet 2 (two) times daily. calcuim carbonate 800 mg plus d3    . zaleplon (SONATA) 5 MG capsule TAKE 1 CAPSULE BY MOUTH AS NEEDED FOR SLEEP    . Coenzyme Q10 200 MG TABS Take 1 tablet by mouth daily.    . NON FORMULARY Tumeric Curcumin 500mg  with 5 mg Bioperine    . Nutritional Supplements (JUICE PLUS FIBRE PO) Take 4 tablets by mouth daily.     Marland Kitchen OVER THE COUNTER MEDICATION daily. Vit d plus K2-1 drop      No current facility-administered medications for this visit.    No Known Allergies  Review of Systems:  Constitutional: Denies fever, chills, diaphoresis, appetite change and fatigue.  HEENT: Denies photophobia, eye pain, redness, hearing loss, ear pain, congestion, sore throat, rhinorrhea, sneezing, mouth sores, neck pain, neck stiffness and tinnitus.   Respiratory: Denies SOB, DOE, cough, chest tightness,  and wheezing.   Cardiovascular: Denies chest pain, palpitations and leg swelling.  Genitourinary: Denies dysuria, urgency, frequency, hematuria, flank pain and difficulty urinating.  Musculoskeletal: Denies myalgias, back pain, joint swelling, arthralgias and gait problem.  Skin: No rash.  Neurological: Denies dizziness, seizures, syncope, weakness, light-headedness, numbness and headaches.  Hematological: Denies adenopathy. Easy bruising, personal or family bleeding history  Psychiatric/Behavioral: Has some anxiety, no depression.     Physical Exam:    Ht 5\' 2"  (1.575 m)   Wt 108 lb (49 kg)   BMI 19.75 kg/m  Wt Readings from Last 3 Encounters:  09/01/19 108 lb (49 kg)  07/08/19 105 lb (47.6 kg)  02/22/18 111 lb (50.3 kg)  Televisit.  I connected with  Katelyn Cruz on 09/01/19 by a video enabled telemedicine application and verified that I am speaking with the correct person using two identifiers.   I discussed the limitations of evaluation and management by telemedicine. The patient expressed understanding and agreed to proceed.  Time spent on coordination of care/phone call: 25 min   Carmell Austria, MD 09/01/2019, 8:56 AM  Cc: Melony Overly, MD

## 2019-09-01 NOTE — Patient Instructions (Signed)
If you are age 58 or older, your body mass index should be between 23-30. Your Body mass index is 19.75 kg/m. If this is out of the aforementioned range listed, please consider follow up with your Primary Care Provider.  If you are age 20 or younger, your body mass index should be between 19-25. Your Body mass index is 19.75 kg/m. If this is out of the aformentioned range listed, please consider follow up with your Primary Care Provider.   We have sent the following medications to your pharmacy for you to pick up at your convenience: Apriso  Follow up 12 weeks.   Thank you,  Dr. Jackquline Denmark

## 2019-09-04 ENCOUNTER — Telehealth: Payer: Self-pay | Admitting: Gastroenterology

## 2019-09-04 NOTE — Telephone Encounter (Signed)
Called and spoke with patient-patient reports the medication Mesalamine is too expensive and is requesting a copay assistance card and for this card to be mailed to the patient-address verified  Will send information to patient if able to locate-otherwise patient to be contacted and informed of alternative measures

## 2019-09-04 NOTE — Telephone Encounter (Signed)
Called and spoke with patient- patient advised that there are not any copay assistance cards at the office- advised to call her insurance company to see what medication would be covered and to also check the website for Apriso to see if patient assistance can be obtained through them;  Patient verbalized appreciation of information and reports she will call her insurance company for information; Patient advised to call back to the office at [phone] should questions/concerns arise;  Patient verbalized understanding of information/instructions;

## 2019-11-14 DIAGNOSIS — D225 Melanocytic nevi of trunk: Secondary | ICD-10-CM | POA: Diagnosis not present

## 2019-11-14 DIAGNOSIS — L821 Other seborrheic keratosis: Secondary | ICD-10-CM | POA: Diagnosis not present

## 2019-11-14 DIAGNOSIS — D1801 Hemangioma of skin and subcutaneous tissue: Secondary | ICD-10-CM | POA: Diagnosis not present

## 2019-11-14 DIAGNOSIS — L578 Other skin changes due to chronic exposure to nonionizing radiation: Secondary | ICD-10-CM | POA: Diagnosis not present

## 2020-01-12 ENCOUNTER — Ambulatory Visit (INDEPENDENT_AMBULATORY_CARE_PROVIDER_SITE_OTHER): Payer: BC Managed Care – PPO | Admitting: Gastroenterology

## 2020-01-12 ENCOUNTER — Encounter: Payer: Self-pay | Admitting: Gastroenterology

## 2020-01-12 VITALS — BP 106/64 | HR 75 | Temp 96.8°F | Ht 62.0 in | Wt 106.4 lb

## 2020-01-12 DIAGNOSIS — R197 Diarrhea, unspecified: Secondary | ICD-10-CM | POA: Diagnosis not present

## 2020-01-12 MED ORDER — DIPHENOXYLATE-ATROPINE 2.5-0.025 MG PO TABS: 1.0000 | ORAL_TABLET | Freq: Three times a day (TID) | ORAL | 0 refills | Status: DC | PRN

## 2020-01-12 NOTE — Patient Instructions (Addendum)
If you are age 58 or older, your body mass index should be between 23-30. Your Body mass index is 19.46 kg/m. If this is out of the aforementioned range listed, please consider follow up with your Primary Care Provider.  If you are age 55 or younger, your body mass index should be between 19-25. Your Body mass index is 19.46 kg/m. If this is out of the aformentioned range listed, please consider follow up with your Primary Care Provider.   We have sent the following medications to your pharmacy for you to pick up at your convenience: Lomotil  Apriso 0.375 4 daily for 12 weeks and then 2 daily.  Continue calcium plus Vitamin D.  AVOID nonsteroidals.  Continue gluten-free and lactose-free diet for now as above.  Follow up in six months.  Please call the office for an appointment as the schedule is not available at this time. . Thank you for choosing me and [facility].   Jackquline Denmark, MD

## 2020-01-12 NOTE — Progress Notes (Signed)
Chief Complaint: FU  Referring Provider:  Melony Overly, MD      ASSESSMENT AND PLAN;   #1.  Diarrhea.  Likely d/t microscopic colitis.  D/d IBS, post-chole diarrhea. R/O other causes. Neg colon with random colonic bx for microscopic colitis 01/2018.  However, previously in 2016, biopsies were positive for microscopic colitis.  Failed cholestyamine and equalactin. Failed lomotil. Neg stool studies including C. Diff, WBCs, culture, fecal elastase.  Responded very well to mesalamine in the past. Neg celiac screen  #2.  Weight loss (resolved)  Plan: -Continue Apriso 0.375 4/day x 12 weeks, then 2/day -Continue calcium plus vitamin D -Lomotil 1 tab po tid prn #60 -She would like to hold off on any further evaluation since she feels significantly better. -Avoid nonsteroidals. -Continue gluten-free and lactose-free diet for now as before. -She wil get routine annual physical with labs with Orlinda Blalock.      HPI:    Katelyn Cruz is a 59 y.o. female  For follow-up visit Doing much better Diarrhea has more or less resolved (over 75 to 80% improvement) She has resumed running.  No further weight loss  Pleased with the progress.   Past Medical History:  Diagnosis Date  . Allergic rhinitis   . Anxiety   . Arthralgia of multiple sites   . Cancer [facility])    Left Breast cancer, 2010  . Elevated liver enzymes   . External hemorrhoids   . Hyperlipidemia   . Insomnia   . Low vitamin D level   . Osteoporosis   . Palpitations     Past Surgical History:  Procedure Laterality Date  . APPENDECTOMY    . CHOLECYSTECTOMY  11/2018   [facility] Dr Noberto Retort  . COLONOSCOPY  03/01/2015   Colonic polyps status post polypectomy. Internal and external hemorrhoids  . PARTIAL MASTECTOMY WITH AXILLARY SENTINEL LYMPH NODE BIOPSY    . TUBAL LIGATION    . VAGINAL HYSTERECTOMY      Family History  Problem Relation Age of Onset  . Colon cancer Neg Hx   . Colon polyps Neg Hx   .  Esophageal cancer Neg Hx   . Stomach cancer Neg Hx   . Rectal cancer Neg Hx   . Pancreatic cancer Neg Hx     Social History   Tobacco Use  . Smoking status: Never Smoker  . Smokeless tobacco: Never Used  Vaping Use  . Vaping Use: Never used  Substance Use Topics  . Alcohol use: Not Currently  . Drug use: Never    Current Outpatient Medications  Medication Sig Dispense Refill  . alendronate (FOSAMAX) 70 MG tablet TAKE 1 TABLET BY MOUTH ONCE (1) A WEEK  0  . Calcium Carbonate-Vitamin D 600-400 MG-UNIT tablet Take 1 tablet by mouth 2 (two) times daily.    . Cholecalciferol (VITAMIN D PO) Take 1 tablet by mouth daily.     . Coenzyme Q10 200 MG TABS Take 1 tablet by mouth daily.    . Cyanocobalamin (VITAMIN B-12 PO) Take 1 tablet by mouth daily.    . Digestive Enzymes (DIGESTIVE ENZYME PO) Take 1 tablet by mouth as directed. Take 1 tablet with each meal    . mesalamine (APRISO) 0.375 g 24 hr capsule Take 4 capsules (1.5 g total) by mouth daily. 120 capsule 6  . NON FORMULARY Tumeric Curcumin 500mg  with 5 mg Bioperine    . Nutritional Supplements (JUICE PLUS FIBRE PO) Take 4 tablets by mouth daily.     Marland Kitchen  OVER THE COUNTER MEDICATION daily. Vit d plus K2-1 drop    . zaleplon (SONATA) 5 MG capsule TAKE 1 CAPSULE BY MOUTH AS NEEDED FOR SLEEP     No current facility-administered medications for this visit.    No Known Allergies  Review of Systems:  neg     Physical Exam:    BP 106/64   Pulse 75   Temp (!) 96.8 F (36 C)   Ht 5\' 2"  (1.575 m)   Wt 106 lb 6 oz (48.3 kg)   BMI 19.46 kg/m  Wt Readings from Last 3 Encounters:  01/12/20 106 lb 6 oz (48.3 kg)  09/01/19 108 lb (49 kg)  07/08/19 105 lb (47.6 kg)   Gen: awake, alert, NAD HEENT: anicteric, no pallor CV: RRR, no mrg Pulm: CTA b/l Abd: soft, NT/ND, +BS throughout Ext: no c/c/e Neuro: nonfocal   Carmell Austria, MD 01/12/2020, 8:56 AM  Cc: Melony Overly, MD

## 2020-01-19 DIAGNOSIS — M810 Age-related osteoporosis without current pathological fracture: Secondary | ICD-10-CM | POA: Diagnosis not present

## 2020-01-19 DIAGNOSIS — Z862 Personal history of diseases of the blood and blood-forming organs and certain disorders involving the immune mechanism: Secondary | ICD-10-CM | POA: Diagnosis not present

## 2020-01-19 DIAGNOSIS — F419 Anxiety disorder, unspecified: Secondary | ICD-10-CM | POA: Diagnosis not present

## 2020-01-19 DIAGNOSIS — Z0000 Encounter for general adult medical examination without abnormal findings: Secondary | ICD-10-CM | POA: Diagnosis not present

## 2020-01-19 DIAGNOSIS — E559 Vitamin D deficiency, unspecified: Secondary | ICD-10-CM | POA: Diagnosis not present

## 2020-01-19 DIAGNOSIS — R7989 Other specified abnormal findings of blood chemistry: Secondary | ICD-10-CM | POA: Diagnosis not present

## 2020-01-19 DIAGNOSIS — G4700 Insomnia, unspecified: Secondary | ICD-10-CM | POA: Diagnosis not present

## 2020-01-19 DIAGNOSIS — Z136 Encounter for screening for cardiovascular disorders: Secondary | ICD-10-CM | POA: Diagnosis not present

## 2020-02-06 DIAGNOSIS — G5761 Lesion of plantar nerve, right lower limb: Secondary | ICD-10-CM | POA: Diagnosis not present

## 2020-02-06 DIAGNOSIS — M7751 Other enthesopathy of right foot: Secondary | ICD-10-CM | POA: Diagnosis not present

## 2020-03-11 DIAGNOSIS — G4700 Insomnia, unspecified: Secondary | ICD-10-CM | POA: Diagnosis not present

## 2020-04-01 ENCOUNTER — Other Ambulatory Visit: Payer: Self-pay | Admitting: Gastroenterology

## 2020-04-20 ENCOUNTER — Telehealth: Payer: Self-pay | Admitting: Gastroenterology

## 2020-04-20 NOTE — Telephone Encounter (Signed)
Please advise 

## 2020-04-23 NOTE — Telephone Encounter (Signed)
Pt called inquiring about previous message. Pls call her.

## 2020-04-26 NOTE — Telephone Encounter (Signed)
I have called and went over instructions with patient. Patient voiced understanding.

## 2020-04-26 NOTE — Telephone Encounter (Signed)
I was trying to figure out what she is actually on generic mesalamine or Apriso If the diarrhea is better, lets try to taper it off Take 1 tablet less for 2 weeks, every 2 weeks and then stop. If diarrhea reoccurs, she needs to go back on. RG

## 2020-04-26 NOTE — Telephone Encounter (Signed)
Please advise 

## 2020-05-12 DIAGNOSIS — L600 Ingrowing nail: Secondary | ICD-10-CM | POA: Diagnosis not present

## 2020-05-14 DIAGNOSIS — G4700 Insomnia, unspecified: Secondary | ICD-10-CM | POA: Diagnosis not present

## 2020-05-14 DIAGNOSIS — R5383 Other fatigue: Secondary | ICD-10-CM | POA: Diagnosis not present

## 2020-05-14 DIAGNOSIS — K529 Noninfective gastroenteritis and colitis, unspecified: Secondary | ICD-10-CM | POA: Diagnosis not present

## 2020-05-14 DIAGNOSIS — E559 Vitamin D deficiency, unspecified: Secondary | ICD-10-CM | POA: Diagnosis not present

## 2020-05-14 DIAGNOSIS — F419 Anxiety disorder, unspecified: Secondary | ICD-10-CM | POA: Diagnosis not present

## 2020-05-14 DIAGNOSIS — M810 Age-related osteoporosis without current pathological fracture: Secondary | ICD-10-CM | POA: Diagnosis not present

## 2020-05-14 DIAGNOSIS — Z789 Other specified health status: Secondary | ICD-10-CM | POA: Diagnosis not present

## 2020-06-02 DIAGNOSIS — G5761 Lesion of plantar nerve, right lower limb: Secondary | ICD-10-CM | POA: Diagnosis not present

## 2020-06-02 DIAGNOSIS — M7751 Other enthesopathy of right foot: Secondary | ICD-10-CM | POA: Diagnosis not present

## 2020-07-21 DIAGNOSIS — G4700 Insomnia, unspecified: Secondary | ICD-10-CM | POA: Diagnosis not present

## 2020-07-21 DIAGNOSIS — Z1231 Encounter for screening mammogram for malignant neoplasm of breast: Secondary | ICD-10-CM | POA: Diagnosis not present

## 2020-07-21 DIAGNOSIS — F419 Anxiety disorder, unspecified: Secondary | ICD-10-CM | POA: Diagnosis not present

## 2020-07-21 DIAGNOSIS — M810 Age-related osteoporosis without current pathological fracture: Secondary | ICD-10-CM | POA: Diagnosis not present

## 2020-07-23 DIAGNOSIS — G4700 Insomnia, unspecified: Secondary | ICD-10-CM | POA: Diagnosis not present

## 2020-07-23 DIAGNOSIS — R799 Abnormal finding of blood chemistry, unspecified: Secondary | ICD-10-CM | POA: Diagnosis not present

## 2020-07-23 DIAGNOSIS — K529 Noninfective gastroenteritis and colitis, unspecified: Secondary | ICD-10-CM | POA: Diagnosis not present

## 2020-07-23 DIAGNOSIS — R5383 Other fatigue: Secondary | ICD-10-CM | POA: Diagnosis not present

## 2020-07-23 DIAGNOSIS — M810 Age-related osteoporosis without current pathological fracture: Secondary | ICD-10-CM | POA: Diagnosis not present

## 2020-08-23 DIAGNOSIS — M810 Age-related osteoporosis without current pathological fracture: Secondary | ICD-10-CM | POA: Diagnosis not present

## 2020-08-23 DIAGNOSIS — Z1231 Encounter for screening mammogram for malignant neoplasm of breast: Secondary | ICD-10-CM | POA: Diagnosis not present

## 2020-08-23 DIAGNOSIS — M8588 Other specified disorders of bone density and structure, other site: Secondary | ICD-10-CM | POA: Diagnosis not present

## 2020-08-26 ENCOUNTER — Encounter: Payer: Self-pay | Admitting: *Deleted

## 2020-08-26 ENCOUNTER — Encounter: Payer: Self-pay | Admitting: Cardiology

## 2020-08-27 DIAGNOSIS — R748 Abnormal levels of other serum enzymes: Secondary | ICD-10-CM | POA: Insufficient documentation

## 2020-08-27 DIAGNOSIS — R002 Palpitations: Secondary | ICD-10-CM | POA: Insufficient documentation

## 2020-08-27 DIAGNOSIS — K644 Residual hemorrhoidal skin tags: Secondary | ICD-10-CM | POA: Insufficient documentation

## 2020-08-27 DIAGNOSIS — J309 Allergic rhinitis, unspecified: Secondary | ICD-10-CM | POA: Insufficient documentation

## 2020-08-27 DIAGNOSIS — E785 Hyperlipidemia, unspecified: Secondary | ICD-10-CM | POA: Insufficient documentation

## 2020-08-27 DIAGNOSIS — M2550 Pain in unspecified joint: Secondary | ICD-10-CM | POA: Insufficient documentation

## 2020-08-27 DIAGNOSIS — R7989 Other specified abnormal findings of blood chemistry: Secondary | ICD-10-CM | POA: Insufficient documentation

## 2020-08-27 DIAGNOSIS — F419 Anxiety disorder, unspecified: Secondary | ICD-10-CM | POA: Insufficient documentation

## 2020-08-27 DIAGNOSIS — G4700 Insomnia, unspecified: Secondary | ICD-10-CM | POA: Insufficient documentation

## 2020-08-27 DIAGNOSIS — M810 Age-related osteoporosis without current pathological fracture: Secondary | ICD-10-CM | POA: Insufficient documentation

## 2020-08-31 ENCOUNTER — Encounter: Payer: Self-pay | Admitting: Cardiology

## 2020-08-31 ENCOUNTER — Other Ambulatory Visit: Payer: Self-pay

## 2020-08-31 ENCOUNTER — Ambulatory Visit (INDEPENDENT_AMBULATORY_CARE_PROVIDER_SITE_OTHER): Payer: BC Managed Care – PPO | Admitting: Cardiology

## 2020-08-31 VITALS — BP 106/74 | HR 69 | Ht 61.0 in | Wt 103.2 lb

## 2020-08-31 DIAGNOSIS — I517 Cardiomegaly: Secondary | ICD-10-CM

## 2020-08-31 DIAGNOSIS — E785 Hyperlipidemia, unspecified: Secondary | ICD-10-CM | POA: Diagnosis not present

## 2020-08-31 NOTE — Patient Instructions (Addendum)
Medication Instructions:  Your physician recommends that you continue on your current medications as directed. Please refer to the Current Medication list given to you today.' *If you need a refill on your cardiac medications before your next appointment, please call your pharmacy*   Lab Work: Your physician recommends that you return for lab work:  TODAY: Lipids, Lpa If you have labs (blood work) drawn today and your tests are completely normal, you will receive your results only by: Marland Kitchen MyChart Message (if you have MyChart) OR . A paper copy in the mail If you have any lab test that is abnormal or we need to change your treatment, we will call you to review the results.   Testing/Procedures: Coronary Calcium Scan  Your physician has requested that you have an echocardiogram. Echocardiography is a painless test that uses sound waves to create images of your heart. It provides your doctor with information about the size and shape of your heart and how well your heart's chambers and valves are working. This procedure takes approximately one hour. There are no restrictions for this procedure.    Follow-Up: At [facility], you and your health needs are our priority.  As part of our continuing mission to provide you with exceptional heart care, we have created designated Provider Care Teams.  These Care Teams include your primary Cardiologist (physician) and Advanced Practice Providers (APPs -  Physician Assistants and Nurse Practitioners) who all work together to provide you with the care you need, when you need it.  We recommend signing up for the patient portal called "MyChart".  Sign up information is provided on this After Visit Summary.  MyChart is used to connect with patients for Virtual Visits (Telemedicine).  Patients are able to view lab/test results, encounter notes, upcoming appointments, etc.  Non-urgent messages can be sent to your provider as well.   To learn more about what you  can do with MyChart, go to [email or web address].    Your next appointment:    As needed   The format for your next appointment:   In Person  Provider:   Berniece Salines, DO   Other Instructions  Echocardiogram An echocardiogram is a test that uses sound waves (ultrasound) to produce images of the heart. Images from an echocardiogram can provide important information about:  Heart size and shape.  The size and thickness and movement of your heart's walls.  Heart muscle function and strength.  Heart valve function or if you have stenosis. Stenosis is when the heart valves are too narrow.  If blood is flowing backward through the heart valves (regurgitation).  A tumor or infectious growth around the heart valves.  Areas of heart muscle that are not working well because of poor blood flow or injury from a heart attack.  Aneurysm detection. An aneurysm is a weak or damaged part of an artery wall. The wall bulges out from the normal force of blood pumping through the body. Tell a health care provider about:  Any allergies you have.  All medicines you are taking, including vitamins, herbs, eye drops, creams, and over-the-counter medicines.  Any blood disorders you have.  Any surgeries you have had.  Any medical conditions you have.  Whether you are pregnant or may be pregnant. What are the risks? Generally, this is a safe test. However, problems may occur, including an allergic reaction to dye (contrast) that may be used during the test. What happens before the test? No specific preparation is needed. You may  eat and drink normally. What happens during the test?  You will take off your clothes from the waist up and put on a hospital gown.  Electrodes or electrocardiogram (ECG)patches may be placed on your chest. The electrodes or patches are then connected to a device that monitors your heart rate and rhythm.  You will lie down on a table for an ultrasound exam. A gel  will be applied to your chest to help sound waves pass through your skin.  A handheld device, called a transducer, will be pressed against your chest and moved over your heart. The transducer produces sound waves that travel to your heart and bounce back (or "echo" back) to the transducer. These sound waves will be captured in real-time and changed into images of your heart that can be viewed on a video monitor. The images will be recorded on a computer and reviewed by your health care provider.  You may be asked to change positions or hold your breath for a short time. This makes it easier to get different views or better views of your heart.  In some cases, you may receive contrast through an IV in one of your veins. This can improve the quality of the pictures from your heart. The procedure may vary among health care providers and hospitals.   What can I expect after the test? You may return to your normal, everyday life, including diet, activities, and medicines, unless your health care provider tells you not to do that. Follow these instructions at home:  It is up to you to get the results of your test. Ask your health care provider, or the department that is doing the test, when your results will be ready.  Keep all follow-up visits. This is important. Summary  An echocardiogram is a test that uses sound waves (ultrasound) to produce images of the heart.  Images from an echocardiogram can provide important information about the size and shape of your heart, heart muscle function, heart valve function, and other possible heart problems.  You do not need to do anything to prepare before this test. You may eat and drink normally.  After the echocardiogram is completed, you may return to your normal, everyday life, unless your health care provider tells you not to do that. This information is not intended to replace advice given to you by your health care provider. Make sure you discuss any  questions you have with your health care provider. Document Revised: 02/10/2020 Document Reviewed: 02/10/2020 Elsevier Patient Education  2021 [employer].   Coronary Calcium Scan A coronary calcium scan is an imaging test used to look for deposits of plaque in the inner lining of the blood vessels of the heart (coronary arteries). Plaque is made up of calcium, protein, and fatty substances. These deposits of plaque can partly clog and narrow the coronary arteries without producing any symptoms or warning signs. This puts a person at risk for a heart attack. This test is recommended for people who are at moderate risk for heart disease. The test can find plaque deposits before symptoms develop. Tell a health care provider about:  Any allergies you have.  All medicines you are taking, including vitamins, herbs, eye drops, creams, and over-the-counter medicines.  Any problems you or family members have had with anesthetic medicines.  Any blood disorders you have.  Any surgeries you have had.  Any medical conditions you have.  Whether you are pregnant or may be pregnant. What are the risks?  Generally, this is a safe procedure. However, problems may occur, including:  Harm to a pregnant woman and her unborn baby. This test involves the use of radiation. Radiation exposure can be dangerous to a pregnant woman and her unborn baby. If you are pregnant or think you may be pregnant, you should not have this procedure done.  Slight increase in the risk of cancer. This is because of the radiation involved in the test. What happens before the procedure? Ask your health care provider for any specific instructions on how to prepare for this procedure. You may be asked to avoid products that contain caffeine, tobacco, or nicotine for 4 hours before the procedure. What happens during the procedure?  You will undress and remove any jewelry from your neck or chest.  You will put on a hospital  gown.  Sticky electrodes will be placed on your chest. The electrodes will be connected to an electrocardiogram (ECG) machine to record a tracing of the electrical activity of your heart.  You will lie down on a curved bed that is attached to the [place].  You may be given medicine to slow down your heart rate so that clear pictures can be created.  You will be moved into the CT scanner, and the CT scanner will take pictures of your heart. During this time, you will be asked to lie still and hold your breath for 2-3 seconds at a time while each picture of your heart is being taken. The procedure may vary among health care providers and hospitals.   What happens after the procedure?  You can get dressed.  You can return to your normal activities.  It is up to you to get the results of your procedure. Ask your health care provider, or the department that is doing the procedure, when your results will be ready. Summary  A coronary calcium scan is an imaging test used to look for deposits of plaque in the inner lining of the blood vessels of the heart (coronary arteries). Plaque is made up of calcium, protein, and fatty substances.  Generally, this is a safe procedure. Tell your health care provider if you are pregnant or may be pregnant.  Ask your health care provider for any specific instructions on how to prepare for this procedure.  A CT scanner will take pictures of your heart.  You can return to your normal activities after the scan is done. This information is not intended to replace advice given to you by your health care provider. Make sure you discuss any questions you have with your health care provider. Document Revised: 01/07/2019 Document Reviewed: 01/07/2019 Elsevier Patient Education  [employer].

## 2020-08-31 NOTE — Progress Notes (Signed)
Cardiology Office Note:    Date:  08/31/2020   ID:  Katelyn Cruz, DOB 04/10/1961, MRN [id number]  PCP:  Earlyne Iba, NP  Cardiologist:  Berniece Salines, DO  Electrophysiologist:  None   Referring MD: Earlyne Iba, NP   I am doing fine but I am concerned about my cholesterol  History of Present Illness:    Katelyn Cruz is a 60 y.o. female with a hx of hyperlipidemia not on any medications, vitamin D deficiency, insomnia, history of breast cancer is here today to establish cardiac care.  The patient tells me that she was referred by her primary care doctor after she requested to see cardiology.  She notes that she in the past have had chest pain but it was back in 2020 and since that time she has not had any more chest pain.  The reason she is concerned is the fact that every year her cholesterol keeps going up.  Her most recent cholesterol back in July 2021 showed total number cholesterol 213, HDL 76, LDL 123.  Past Medical History:  Diagnosis Date  . Allergic rhinitis   . Anxiety   . Arthralgia of multiple sites   . Calculus of gallbladder with chronic cholecystitis without obstruction 08/27/2018  . Cancer [facility])    Left Breast cancer, 2010  . Elevated LFTs 08/27/2018  . Elevated liver enzymes   . Epigastric pain 08/27/2018  . External hemorrhoids   . Hyperlipidemia   . Insomnia   . Low vitamin D level   . Malignant neoplasm of upper-outer quadrant of left breast in female, estrogen receptor positive ([place]) 08/01/2016  . Osteoporosis   . Palpitations   . Tibialis posterior tendinitis, right 09/27/2016    Past Surgical History:  Procedure Laterality Date  . APPENDECTOMY    . CHOLECYSTECTOMY  11/2018   [facility]. Dr Noberto Retort  . COLONOSCOPY  03/01/2015   Colonic polyps status post polypectomy. Internal and external hemorrhoids  . MASTECTOMY Left    [employer]  . PARTIAL MASTECTOMY WITH AXILLARY SENTINEL LYMPH NODE BIOPSY    . TUBAL LIGATION    . UMBILICAL HERNIA REPAIR    .  VAGINAL HYSTERECTOMY  10/24/2011    Current Medications: Current Meds  Medication Sig  . alendronate (FOSAMAX) 70 MG tablet TAKE 1 TABLET BY MOUTH ONCE (1) A WEEK  . Calcium Carbonate-Vitamin D 600-400 MG-UNIT tablet Take 1 tablet by mouth 2 (two) times daily.  . Cholecalciferol (VITAMIN D PO) Take 1 tablet by mouth daily.   . Cyanocobalamin (VITAMIN B-12 PO) Take 1 tablet by mouth daily.  . Nutritional Supplements (JUICE PLUS FIBRE PO) Take 4 tablets by mouth daily.   Marland Kitchen OVER THE COUNTER MEDICATION daily. Vit d plus K2-1 drop  . venlafaxine XR (EFFEXOR-XR) 75 MG 24 hr capsule Take 75 mg by mouth daily.  Marland Kitchen zolpidem (AMBIEN CR) 12.5 MG CR tablet Take 12.5 mg by mouth at bedtime as needed for sleep.     Allergies:   Patient has no known allergies.   Social History   Socioeconomic History  . Marital status: Married    Spouse name: Not on file  . Number of children: Not on file  . Years of education: Not on file  . Highest education level: Not on file  Occupational History  . Not on file  Tobacco Use  . Smoking status: Never Smoker  . Smokeless tobacco: Never Used  Vaping Use  . Vaping Use: Never used  Substance and Sexual  Activity  . Alcohol use: Not Currently  . Drug use: Never  . Sexual activity: Not on file  Other Topics Concern  . Not on file  Social History Narrative  . Not on file   Social Determinants of Health   Financial Resource Strain: Not on file  Food Insecurity: Not on file  Transportation Needs: Not on file  Physical Activity: Not on file  Stress: Not on file  Social Connections: Not on file     Family History: The patient's family history includes Breast cancer in her mother; Heart disease in her mother; Hypertension in her mother. There is no history of Colon cancer, Colon polyps, Esophageal cancer, Stomach cancer, Rectal cancer, or Pancreatic cancer.  ROS:   Review of Systems  Constitution: Negative for decreased appetite, fever and weight gain.   HENT: Negative for congestion, ear discharge, hoarse voice and sore throat.   Eyes: Negative for discharge, redness, vision loss in right eye and visual halos.  Cardiovascular: Negative for chest pain, dyspnea on exertion, leg swelling, orthopnea and palpitations.  Respiratory: Negative for cough, hemoptysis, shortness of breath and snoring.   Endocrine: Negative for heat intolerance and polyphagia.  Hematologic/Lymphatic: Negative for bleeding problem. Does not bruise/bleed easily.  Skin: Negative for flushing, nail changes, rash and suspicious lesions.  Musculoskeletal: Negative for arthritis, joint pain, muscle cramps, myalgias, neck pain and stiffness.  Gastrointestinal: Negative for abdominal pain, bowel incontinence, diarrhea and excessive appetite.  Genitourinary: Negative for decreased libido, genital sores and incomplete emptying.  Neurological: Negative for brief paralysis, focal weakness, headaches and loss of balance.  Psychiatric/Behavioral: Negative for altered mental status, depression and suicidal ideas.  Allergic/Immunologic: Negative for HIV exposure and persistent infections.    EKGs/Labs/Other Studies Reviewed:    The following studies were reviewed today:   EKG:  The ekg ordered today demonstrates a, heart rate 69 bpm with P wave morphology suggesting left atrial enlargement and underlying right bundle branch block.  Recent Labs: No results found for requested labs within last 8760 hours.  Recent Lipid Panel No results found for: CHOL, TRIG, HDL, CHOLHDL, VLDL, LDLCALC, LDLDIRECT  Physical Exam:    VS:  BP 106/74 (BP Location: Right Arm)   Pulse 69   Ht 5\' 1"  (1.549 m)   Wt 103 lb 3.2 oz (46.8 kg)   SpO2 99%   BMI 19.50 kg/m     Wt Readings from Last 3 Encounters:  08/31/20 103 lb 3.2 oz (46.8 kg)  01/19/20 105 lb (47.6 kg)  01/12/20 106 lb 6 oz (48.3 kg)     GEN: Well nourished, well developed in no acute distress HEENT: Normal NECK: No JVD; No  carotid bruits LYMPHATICS: No lymphadenopathy CARDIAC: S1S2 noted,RRR, no murmurs, rubs, gallops RESPIRATORY:  Clear to auscultation without rales, wheezing or rhonchi  ABDOMEN: Soft, non-tender, non-distended, +bowel sounds, no guarding. EXTREMITIES: No edema, No cyanosis, no clubbing MUSCULOSKELETAL:  No deformity  SKIN: Warm and dry NEUROLOGIC:  Alert and oriented x 3, non-focal PSYCHIATRIC:  Normal affect, good insight  ASSESSMENT:    1. Hyperlipidemia, unspecified hyperlipidemia type   2. Left atrial enlargement    PLAN:     For hyperlipidemia we will going to repeat her lipid profile.  I have educated the patient I think since her last testing was done in July we should start with a new set of lab work so we will get lipid profile and LP(a).  She also is interested in getting a coronary CT scan which  this will be ordered today.  Calculated her ASCVD risk score 1.5% 10-year ASCVD risk.  Her echocardiogram showed evidence of left atrial enlargement we will get an echocardiogram to make sure there is no structural abnormalities.  The patient is in agreement with the above plan. The patient left the office in stable condition.  The patient will follow up as needed   Medication Adjustments/Labs and Tests Ordered: Current medicines are reviewed at length with the patient today.  Concerns regarding medicines are outlined above.  Orders Placed This Encounter  Procedures  . CT CARDIAC SCORING (SELF PAY ONLY)  . Lipid panel  . Lipoprotein A (LPA)  . EKG 12-Lead  . ECHOCARDIOGRAM COMPLETE   No orders of the defined types were placed in this encounter.   Patient Instructions   Medication Instructions:  Your physician recommends that you continue on your current medications as directed. Please refer to the Current Medication list given to you today.' *If you need a refill on your cardiac medications before your next appointment, please call your pharmacy*   Lab Work: Your  physician recommends that you return for lab work:  TODAY: Lipids, Lpa If you have labs (blood work) drawn today and your tests are completely normal, you will receive your results only by: Marland Kitchen MyChart Message (if you have MyChart) OR . A paper copy in the mail If you have any lab test that is abnormal or we need to change your treatment, we will call you to review the results.   Testing/Procedures: Coronary Calcium Scan  Your physician has requested that you have an echocardiogram. Echocardiography is a painless test that uses sound waves to create images of your heart. It provides your doctor with information about the size and shape of your heart and how well your heart's chambers and valves are working. This procedure takes approximately one hour. There are no restrictions for this procedure.    Follow-Up: At [facility], you and your health needs are our priority.  As part of our continuing mission to provide you with exceptional heart care, we have created designated Provider Care Teams.  These Care Teams include your primary Cardiologist (physician) and Advanced Practice Providers (APPs -  Physician Assistants and Nurse Practitioners) who all work together to provide you with the care you need, when you need it.  We recommend signing up for the patient portal called "MyChart".  Sign up information is provided on this After Visit Summary.  MyChart is used to connect with patients for Virtual Visits (Telemedicine).  Patients are able to view lab/test results, encounter notes, upcoming appointments, etc.  Non-urgent messages can be sent to your provider as well.   To learn more about what you can do with MyChart, go to [email or web address].    Your next appointment:    As needed   The format for your next appointment:   In Person  Provider:   Berniece Salines, DO   Other Instructions  Echocardiogram An echocardiogram is a test that uses sound waves (ultrasound) to produce images  of the heart. Images from an echocardiogram can provide important information about:  Heart size and shape.  The size and thickness and movement of your heart's walls.  Heart muscle function and strength.  Heart valve function or if you have stenosis. Stenosis is when the heart valves are too narrow.  If blood is flowing backward through the heart valves (regurgitation).  A tumor or infectious growth around the heart valves.  Areas of heart  muscle that are not working well because of poor blood flow or injury from a heart attack.  Aneurysm detection. An aneurysm is a weak or damaged part of an artery wall. The wall bulges out from the normal force of blood pumping through the body. Tell a health care provider about:  Any allergies you have.  All medicines you are taking, including vitamins, herbs, eye drops, creams, and over-the-counter medicines.  Any blood disorders you have.  Any surgeries you have had.  Any medical conditions you have.  Whether you are pregnant or may be pregnant. What are the risks? Generally, this is a safe test. However, problems may occur, including an allergic reaction to dye (contrast) that may be used during the test. What happens before the test? No specific preparation is needed. You may eat and drink normally. What happens during the test?  You will take off your clothes from the waist up and put on a hospital gown.  Electrodes or electrocardiogram (ECG)patches may be placed on your chest. The electrodes or patches are then connected to a device that monitors your heart rate and rhythm.  You will lie down on a table for an ultrasound exam. A gel will be applied to your chest to help sound waves pass through your skin.  A handheld device, called a transducer, will be pressed against your chest and moved over your heart. The transducer produces sound waves that travel to your heart and bounce back (or "echo" back) to the transducer. These sound  waves will be captured in real-time and changed into images of your heart that can be viewed on a video monitor. The images will be recorded on a computer and reviewed by your health care provider.  You may be asked to change positions or hold your breath for a short time. This makes it easier to get different views or better views of your heart.  In some cases, you may receive contrast through an IV in one of your veins. This can improve the quality of the pictures from your heart. The procedure may vary among health care providers and hospitals.   What can I expect after the test? You may return to your normal, everyday life, including diet, activities, and medicines, unless your health care provider tells you not to do that. Follow these instructions at home:  It is up to you to get the results of your test. Ask your health care provider, or the department that is doing the test, when your results will be ready.  Keep all follow-up visits. This is important. Summary  An echocardiogram is a test that uses sound waves (ultrasound) to produce images of the heart.  Images from an echocardiogram can provide important information about the size and shape of your heart, heart muscle function, heart valve function, and other possible heart problems.  You do not need to do anything to prepare before this test. You may eat and drink normally.  After the echocardiogram is completed, you may return to your normal, everyday life, unless your health care provider tells you not to do that. This information is not intended to replace advice given to you by your health care provider. Make sure you discuss any questions you have with your health care provider. Document Revised: 02/10/2020 Document Reviewed: 02/10/2020 Elsevier Patient Education  2021 [place].   Coronary Calcium Scan A coronary calcium scan is an imaging test used to look for deposits of plaque in the inner lining of the  blood  vessels of the heart (coronary arteries). Plaque is made up of calcium, protein, and fatty substances. These deposits of plaque can partly clog and narrow the coronary arteries without producing any symptoms or warning signs. This puts a person at risk for a heart attack. This test is recommended for people who are at moderate risk for heart disease. The test can find plaque deposits before symptoms develop. Tell a health care provider about:  Any allergies you have.  All medicines you are taking, including vitamins, herbs, eye drops, creams, and over-the-counter medicines.  Any problems you or family members have had with anesthetic medicines.  Any blood disorders you have.  Any surgeries you have had.  Any medical conditions you have.  Whether you are pregnant or may be pregnant. What are the risks? Generally, this is a safe procedure. However, problems may occur, including:  Harm to a pregnant woman and her unborn baby. This test involves the use of radiation. Radiation exposure can be dangerous to a pregnant woman and her unborn baby. If you are pregnant or think you may be pregnant, you should not have this procedure done.  Slight increase in the risk of cancer. This is because of the radiation involved in the test. What happens before the procedure? Ask your health care provider for any specific instructions on how to prepare for this procedure. You may be asked to avoid products that contain caffeine, tobacco, or nicotine for 4 hours before the procedure. What happens during the procedure?  You will undress and remove any jewelry from your neck or chest.  You will put on a hospital gown.  Sticky electrodes will be placed on your chest. The electrodes will be connected to an electrocardiogram (ECG) machine to record a tracing of the electrical activity of your heart.  You will lie down on a curved bed that is attached to the [place].  You may be given medicine to slow down  your heart rate so that clear pictures can be created.  You will be moved into the CT scanner, and the CT scanner will take pictures of your heart. During this time, you will be asked to lie still and hold your breath for 2-3 seconds at a time while each picture of your heart is being taken. The procedure may vary among health care providers and hospitals.   What happens after the procedure?  You can get dressed.  You can return to your normal activities.  It is up to you to get the results of your procedure. Ask your health care provider, or the department that is doing the procedure, when your results will be ready. Summary  A coronary calcium scan is an imaging test used to look for deposits of plaque in the inner lining of the blood vessels of the heart (coronary arteries). Plaque is made up of calcium, protein, and fatty substances.  Generally, this is a safe procedure. Tell your health care provider if you are pregnant or may be pregnant.  Ask your health care provider for any specific instructions on how to prepare for this procedure.  A CT scanner will take pictures of your heart.  You can return to your normal activities after the scan is done. This information is not intended to replace advice given to you by your health care provider. Make sure you discuss any questions you have with your health care provider. Document Revised: 01/07/2019 Document Reviewed: 01/07/2019 Elsevier Patient Education  [place].  Adopting a Healthy Lifestyle.  Know what a healthy weight is for you (roughly BMI <25) and aim to maintain this   Aim for 7+ servings of fruits and vegetables daily   65-80+ fluid ounces of water or unsweet tea for healthy kidneys   Limit to max 1 drink of alcohol per day; avoid smoking/tobacco   Limit animal fats in diet for cholesterol and heart health - choose grass fed whenever available   Avoid highly processed foods, and foods high in  saturated/trans fats   Aim for low stress - take time to unwind and care for your mental health   Aim for 150 min of moderate intensity exercise weekly for heart health, and weights twice weekly for bone health   Aim for 7-9 hours of sleep daily   When it comes to diets, agreement about the perfect plan isnt easy to find, even among the experts. Experts at the [employer] developed an idea known as the Healthy Eating Plate. Just imagine a plate divided into logical, healthy portions.   The emphasis is on diet quality:   Load up on vegetables and fruits - one-half of your plate: Aim for color and variety, and remember that potatoes dont count.   Go for whole grains - one-quarter of your plate: Whole wheat, barley, wheat berries, quinoa, oats, brown rice, and foods made with them. If you want pasta, go with whole wheat pasta.   Protein power - one-quarter of your plate: Fish, chicken, beans, and nuts are all healthy, versatile protein sources. Limit red meat.   The diet, however, does go beyond the plate, offering a few other suggestions.   Use healthy plant oils, such as olive, canola, soy, corn, sunflower and peanut. Check the labels, and avoid partially hydrogenated oil, which have unhealthy trans fats.   If youre thirsty, drink water. Coffee and tea are good in moderation, but skip sugary drinks and limit milk and dairy products to one or two daily servings.   The type of carbohydrate in the diet is more important than the amount. Some sources of carbohydrates, such as vegetables, fruits, whole grains, and beans-are healthier than others.   Finally, stay active  Signed, Berniece Salines, DO  08/31/2020 9:17 AM    [place]

## 2020-09-01 LAB — LIPOPROTEIN A (LPA): Lipoprotein (a): 178.4 nmol/L — ABNORMAL HIGH (ref <75.0)

## 2020-09-01 LAB — LIPID PANEL
Chol/HDL Ratio: 2.3 ratio (ref 0.0–4.4)
Cholesterol, Total: 173 mg/dL (ref 100–199)
HDL: 76 mg/dL (ref >39)
LDL Chol Calc (NIH): 84 mg/dL (ref 0–99)
Triglycerides: 69 mg/dL (ref 0–149)
VLDL Cholesterol Cal: 13 mg/dL (ref 5–40)

## 2020-09-14 ENCOUNTER — Other Ambulatory Visit: Payer: Self-pay

## 2020-09-14 ENCOUNTER — Ambulatory Visit (INDEPENDENT_AMBULATORY_CARE_PROVIDER_SITE_OTHER)
Admission: RE | Admit: 2020-09-14 | Discharge: 2020-09-14 | Disposition: A | Discharge status: Home / Self Care | Payer: Self-pay | Source: Ambulatory Visit | Attending: Cardiology | Admitting: Cardiology

## 2020-09-14 DIAGNOSIS — E785 Hyperlipidemia, unspecified: Secondary | ICD-10-CM

## 2020-09-17 ENCOUNTER — Telehealth: Payer: Self-pay | Admitting: Emergency Medicine

## 2020-09-17 MED ORDER — ROSUVASTATIN CALCIUM 5 MG PO TABS: 5.0000 mg | ORAL_TABLET | Freq: Every day | ORAL | 1 refills | Status: DC

## 2020-09-17 NOTE — Telephone Encounter (Signed)
Patient informed of ct results and Dr. Terrial Rhodes recommendation to start crestor 5 mg daily. Patient understood medication sent in.

## 2020-09-17 NOTE — Telephone Encounter (Signed)
-----   Message from Berniece Salines, DO sent at 09/17/2020  8:06 AM EDT ----- I recommend starting Crestor 5 mg daily.

## 2020-09-29 ENCOUNTER — Ambulatory Visit (INDEPENDENT_AMBULATORY_CARE_PROVIDER_SITE_OTHER): Payer: BC Managed Care – PPO

## 2020-09-29 ENCOUNTER — Other Ambulatory Visit: Payer: Self-pay

## 2020-09-29 DIAGNOSIS — I517 Cardiomegaly: Secondary | ICD-10-CM | POA: Diagnosis not present

## 2020-09-29 LAB — ECHOCARDIOGRAM COMPLETE
Area-P 1/2: 4.39 cm2
S' Lateral: 2.6 cm

## 2020-09-29 NOTE — Progress Notes (Signed)
Complete echocardiogram performed.  Jimmy Norberta Stobaugh RDCS, RVT  

## 2020-10-22 DIAGNOSIS — R5383 Other fatigue: Secondary | ICD-10-CM | POA: Diagnosis not present

## 2020-10-22 DIAGNOSIS — R7309 Other abnormal glucose: Secondary | ICD-10-CM | POA: Diagnosis not present

## 2020-10-22 DIAGNOSIS — K589 Irritable bowel syndrome without diarrhea: Secondary | ICD-10-CM | POA: Diagnosis not present

## 2020-10-22 DIAGNOSIS — G4700 Insomnia, unspecified: Secondary | ICD-10-CM | POA: Diagnosis not present

## 2020-11-03 DIAGNOSIS — F419 Anxiety disorder, unspecified: Secondary | ICD-10-CM | POA: Diagnosis not present

## 2020-11-03 DIAGNOSIS — E785 Hyperlipidemia, unspecified: Secondary | ICD-10-CM | POA: Diagnosis not present

## 2020-11-03 DIAGNOSIS — Z681 Body mass index (BMI) 19 or less, adult: Secondary | ICD-10-CM | POA: Diagnosis not present

## 2020-11-03 DIAGNOSIS — R7989 Other specified abnormal findings of blood chemistry: Secondary | ICD-10-CM | POA: Diagnosis not present

## 2020-11-03 DIAGNOSIS — Z862 Personal history of diseases of the blood and blood-forming organs and certain disorders involving the immune mechanism: Secondary | ICD-10-CM | POA: Diagnosis not present

## 2020-11-03 DIAGNOSIS — G4700 Insomnia, unspecified: Secondary | ICD-10-CM | POA: Diagnosis not present

## 2020-11-03 DIAGNOSIS — E559 Vitamin D deficiency, unspecified: Secondary | ICD-10-CM | POA: Diagnosis not present

## 2020-11-03 DIAGNOSIS — R7303 Prediabetes: Secondary | ICD-10-CM | POA: Diagnosis not present

## 2020-11-12 DIAGNOSIS — L821 Other seborrheic keratosis: Secondary | ICD-10-CM | POA: Diagnosis not present

## 2020-11-12 DIAGNOSIS — L578 Other skin changes due to chronic exposure to nonionizing radiation: Secondary | ICD-10-CM | POA: Diagnosis not present

## 2020-11-12 DIAGNOSIS — D225 Melanocytic nevi of trunk: Secondary | ICD-10-CM | POA: Diagnosis not present

## 2020-11-19 DIAGNOSIS — J069 Acute upper respiratory infection, unspecified: Secondary | ICD-10-CM | POA: Diagnosis not present

## 2020-11-19 DIAGNOSIS — H43811 Vitreous degeneration, right eye: Secondary | ICD-10-CM | POA: Diagnosis not present

## 2020-11-26 ENCOUNTER — Telehealth: Payer: Self-pay | Admitting: Cardiology

## 2020-11-26 ENCOUNTER — Other Ambulatory Visit: Payer: Self-pay

## 2020-11-26 DIAGNOSIS — Z79899 Other long term (current) drug therapy: Secondary | ICD-10-CM

## 2020-11-26 NOTE — Telephone Encounter (Signed)
Pt is calling in regards to her liver enzymes being  elevated, she had labwork on 11/03/20, pt wants to know if she should still be taking the Rosuvastatin 5mg 

## 2020-11-26 NOTE — Telephone Encounter (Signed)
Spoke with about her lab work. She states she had recent labs and the liver enzymes are elevated at AST 32 was 27, ALT 37 was 22. She states her total cholesterol has improved. She will try to send the lab results through my chart or fax them over. Will relay information and question to Dr. Harriet Masson.

## 2020-11-27 DIAGNOSIS — J0100 Acute maxillary sinusitis, unspecified: Secondary | ICD-10-CM | POA: Diagnosis not present

## 2020-11-27 DIAGNOSIS — Z20828 Contact with and (suspected) exposure to other viral communicable diseases: Secondary | ICD-10-CM | POA: Diagnosis not present

## 2020-11-27 DIAGNOSIS — R509 Fever, unspecified: Secondary | ICD-10-CM | POA: Diagnosis not present

## 2021-01-06 DIAGNOSIS — Z681 Body mass index (BMI) 19 or less, adult: Secondary | ICD-10-CM | POA: Diagnosis not present

## 2021-01-06 DIAGNOSIS — R197 Diarrhea, unspecified: Secondary | ICD-10-CM | POA: Diagnosis not present

## 2021-01-07 DIAGNOSIS — R197 Diarrhea, unspecified: Secondary | ICD-10-CM | POA: Diagnosis not present

## 2021-01-20 ENCOUNTER — Other Ambulatory Visit: Payer: Self-pay | Admitting: *Deleted

## 2021-01-20 DIAGNOSIS — E785 Hyperlipidemia, unspecified: Secondary | ICD-10-CM

## 2021-01-20 DIAGNOSIS — R7989 Other specified abnormal findings of blood chemistry: Secondary | ICD-10-CM

## 2021-01-20 DIAGNOSIS — Z79899 Other long term (current) drug therapy: Secondary | ICD-10-CM

## 2021-01-20 LAB — LIPID PANEL
Chol/HDL Ratio: 2.5 ratio (ref 0.0–4.4)
Cholesterol, Total: 194 mg/dL (ref 100–199)
HDL: 77 mg/dL (ref >39)
LDL Chol Calc (NIH): 102 mg/dL — ABNORMAL HIGH (ref 0–99)
Triglycerides: 83 mg/dL (ref 0–149)
VLDL Cholesterol Cal: 15 mg/dL (ref 5–40)

## 2021-01-20 LAB — BASIC METABOLIC PANEL
BUN/Creatinine Ratio: 12 (ref 9–23)
BUN: 10 mg/dL (ref 6–24)
CO2: 23 mmol/L (ref 20–29)
Calcium: 9.5 mg/dL (ref 8.7–10.2)
Chloride: 102 mmol/L (ref 96–106)
Creatinine, Ser: 0.83 mg/dL (ref 0.57–1.00)
Glucose: 93 mg/dL (ref 65–99)
Potassium: 4.3 mmol/L (ref 3.5–5.2)
Sodium: 139 mmol/L (ref 134–144)
eGFR: 82 mL/min/{1.73_m2} (ref >59)

## 2021-01-20 LAB — MAGNESIUM: Magnesium: 2.1 mg/dL (ref 1.6–2.3)

## 2021-01-20 LAB — HEPATIC FUNCTION PANEL
ALT: 28 IU/L (ref 0–32)
AST: 32 IU/L (ref 0–40)
Albumin: 4.5 g/dL (ref 3.8–4.9)
Alkaline Phosphatase: 95 IU/L (ref 44–121)
Bilirubin Total: 0.5 mg/dL (ref 0.0–1.2)
Bilirubin, Direct: 0.12 mg/dL (ref 0.00–0.40)
Total Protein: 6.9 g/dL (ref 6.0–8.5)

## 2021-01-21 ENCOUNTER — Telehealth: Payer: Self-pay

## 2021-01-21 MED ORDER — PRAVASTATIN SODIUM 20 MG PO TABS: 20.0000 mg | ORAL_TABLET | Freq: Every evening | ORAL | 3 refills | Status: DC

## 2021-01-21 NOTE — Telephone Encounter (Signed)
Spoke with patient regarding results and recommendation.  Patient verbalizes understanding and is agreeable to plan of care. Advised patient to call back with any issues or concerns.  

## 2021-01-21 NOTE — Telephone Encounter (Signed)
-----   Message from Richardo Priest, MD sent at 01/21/2021  1:18 PM EDT ----- Normal or stable result  Good results I would continue her current statin

## 2021-02-11 ENCOUNTER — Other Ambulatory Visit: Payer: Self-pay | Admitting: Cardiology

## 2021-02-15 ENCOUNTER — Ambulatory Visit (INDEPENDENT_AMBULATORY_CARE_PROVIDER_SITE_OTHER): Payer: BC Managed Care – PPO | Admitting: Gastroenterology

## 2021-02-15 ENCOUNTER — Encounter: Payer: Self-pay | Admitting: Gastroenterology

## 2021-02-15 ENCOUNTER — Other Ambulatory Visit: Payer: Self-pay

## 2021-02-15 VITALS — BP 104/70 | HR 70 | Ht 62.0 in | Wt 101.1 lb

## 2021-02-15 DIAGNOSIS — R197 Diarrhea, unspecified: Secondary | ICD-10-CM | POA: Diagnosis not present

## 2021-02-15 MED ORDER — DICYCLOMINE HCL 10 MG PO CAPS: 10.0000 mg | ORAL_CAPSULE | Freq: Two times a day (BID) | ORAL | 3 refills | Status: DC

## 2021-02-15 NOTE — Progress Notes (Signed)
Chief Complaint: FU  Referring Provider:  Earlyne Iba, NP      ASSESSMENT AND PLAN;   #1.  Diarrhea.  Likely d/t microscopic colitis.  D/d IBS, post-chole diarrhea. R/O other causes. Neg colon with random colonic bx for microscopic colitis 01/2018.  However, previously in 2016, biopsies were positive for microscopic colitis.  Failed cholestyamine and equalactin. Failed lomotil. Neg stool studies including C. Diff, WBCs, culture, fecal elastase.  Responded very well to mesalamine in the past. Neg celiac screen. Failed lomotil.  #2.  Weight loss (resolved)  Plan: -Oscal 2/day -Stop Mg supplements -Continue fiber-Acacia Congo 8g/day -Trial of Bentyl '10mg'$  po BID #60 6 refiils. Will call in bentyl -If still diarrhea try budesonide. If still, SIBO breath test. -She would like to hold off on any further evaluation since she feels significantly better. -Avoid nonsteroidals. -Continue gluten-free and lactose-free diet for now as before. -She wil get routine annual physical with labs with Orlinda Blalock.      HPI:    Katelyn Cruz is a 60 y.o. female  For follow-up visit  Diarrhea 1/day, mostly morning and worse when she runs- then 3-5 times x 10 yrs.  On bone supplement vits (with Mg 400)  Stopped Apriso d/t cost.  No nausea, vomiting, heartburn, regurgitation, odynophagia or dysphagia.  No significant constipation.  No melena or hematochezia. No unintentional weight loss. No abdominal pain.  No bloating.  No fever chills or night sweats.   Past Medical History:  Diagnosis Date   Allergic rhinitis    Anxiety    Arthralgia of multiple sites    Calculus of gallbladder with chronic cholecystitis without obstruction 08/27/2018   Cancer ([place])    Left Breast cancer, 2010   Elevated LFTs 08/27/2018   Elevated liver enzymes    Epigastric pain 08/27/2018   External hemorrhoids    Hyperlipidemia    Insomnia    Low vitamin D level    Malignant neoplasm of upper-outer quadrant  of left breast in female, estrogen receptor positive ([place]) 08/01/2016   Osteoporosis    Palpitations    Tibialis posterior tendinitis, right 09/27/2016    Past Surgical History:  Procedure Laterality Date   APPENDECTOMY     CHOLECYSTECTOMY  11/2018   [facility]. Dr Noberto Retort   COLONOSCOPY  03/01/2015   Colonic polyps status post polypectomy. Internal and external hemorrhoids   MASTECTOMY Left    Subtotal   PARTIAL MASTECTOMY WITH AXILLARY SENTINEL LYMPH NODE BIOPSY     TUBAL LIGATION     UMBILICAL HERNIA REPAIR     VAGINAL HYSTERECTOMY  10/24/2011    Family History  Problem Relation Age of Onset   Heart disease Mother    Hypertension Mother    Breast cancer Mother    Colon cancer Neg Hx    Colon polyps Neg Hx    Esophageal cancer Neg Hx    Stomach cancer Neg Hx    Rectal cancer Neg Hx    Pancreatic cancer Neg Hx     Social History   Tobacco Use   Smoking status: Never   Smokeless tobacco: Never  Vaping Use   Vaping Use: Never used  Substance Use Topics   Alcohol use: Not Currently   Drug use: Never    Current Outpatient Medications  Medication Sig Dispense Refill   alendronate (FOSAMAX) 70 MG tablet TAKE 1 TABLET BY MOUTH ONCE (1) A WEEK  0   Calcium Carbonate-Vitamin D 600-400 MG-UNIT tablet Take 1  tablet by mouth 2 (two) times daily.     Cholecalciferol (VITAMIN D PO) Take 1 tablet by mouth daily.      Cyanocobalamin (VITAMIN B-12 PO) Take 1 tablet by mouth daily.     Fiber POWD Take 2 Scoops by mouth. Acacia Congo Tummy fiber     OVER THE COUNTER MEDICATION daily. Vit d plus K2-1 drop     pravastatin (PRAVACHOL) 20 MG tablet Take 1 tablet (20 mg total) by mouth every evening. 90 tablet 3   venlafaxine XR (EFFEXOR-XR) 75 MG 24 hr capsule Take 75 mg by mouth daily.     zaleplon (SONATA) 5 MG capsule Take 1 capsule by mouth daily.     zolpidem (AMBIEN CR) 12.5 MG CR tablet Take 12.5 mg by mouth at bedtime as needed for sleep.     No current  facility-administered medications for this visit.    No Known Allergies  Review of Systems:  neg     Physical Exam:    BP 104/70   Pulse 70   Ht '5\' 2"'$  (1.575 m)   Wt 101 lb 2 oz (45.9 kg)   SpO2 99%   BMI 18.50 kg/m  Wt Readings from Last 3 Encounters:  02/15/21 101 lb 2 oz (45.9 kg)  08/31/20 103 lb 3.2 oz (46.8 kg)  01/19/20 105 lb (47.6 kg)   Gen: awake, alert, NAD HEENT: anicteric, no pallor CV: RRR, no mrg Pulm: CTA b/l Abd: soft, NT/ND, +BS throughout Ext: no c/c/e Neuro: nonfocal   Carmell Austria, MD 02/15/2021, 10:49 AM  Cc: Earlyne Iba, NP

## 2021-02-15 NOTE — Patient Instructions (Signed)
If you are age 59 or older, your body mass index should be between 23-30. Your Body mass index is 18.5 kg/m. If this is out of the aforementioned range listed, please consider follow up with your Primary Care Provider.  If you are age 22 or younger, your body mass index should be between 19-25. Your Body mass index is 18.5 kg/m. If this is out of the aformentioned range listed, please consider follow up with your Primary Care Provider.   __________________________________________________________  The [facility] providers would like to encourage you to use [facility] to communicate with providers for non-urgent requests or questions.  Due to long hold times on the telephone, sending your provider a message by [facility] may be a faster and more efficient way to get a response.  Please allow 48 business hours for a response.  Please remember that this is for non-urgent requests.   Stop magnesium  We have sent the following medications to your pharmacy for you to pick up at your convenience: Bentyl  Please call with any questions or concerns.  Thank you,  Dr. Jackquline Denmark

## 2021-02-23 DIAGNOSIS — E785 Hyperlipidemia, unspecified: Secondary | ICD-10-CM | POA: Diagnosis not present

## 2021-02-23 DIAGNOSIS — R7989 Other specified abnormal findings of blood chemistry: Secondary | ICD-10-CM | POA: Diagnosis not present

## 2021-02-23 LAB — COMPREHENSIVE METABOLIC PANEL
ALT: 41 IU/L — ABNORMAL HIGH (ref 0–32)
AST: 34 IU/L (ref 0–40)
Albumin/Globulin Ratio: 2.3 — ABNORMAL HIGH (ref 1.2–2.2)
Albumin: 4.3 g/dL (ref 3.8–4.9)
Alkaline Phosphatase: 82 IU/L (ref 44–121)
BUN/Creatinine Ratio: 17 (ref 9–23)
BUN: 13 mg/dL (ref 6–24)
Bilirubin Total: 0.6 mg/dL (ref 0.0–1.2)
CO2: 26 mmol/L (ref 20–29)
Calcium: 8.8 mg/dL (ref 8.7–10.2)
Chloride: 101 mmol/L (ref 96–106)
Creatinine, Ser: 0.78 mg/dL (ref 0.57–1.00)
Globulin, Total: 1.9 g/dL (ref 1.5–4.5)
Glucose: 89 mg/dL (ref 65–99)
Potassium: 4.1 mmol/L (ref 3.5–5.2)
Sodium: 139 mmol/L (ref 134–144)
Total Protein: 6.2 g/dL (ref 6.0–8.5)
eGFR: 88 mL/min/{1.73_m2} (ref >59)

## 2021-02-23 LAB — LIPID PANEL
Chol/HDL Ratio: 2.4 ratio (ref 0.0–4.4)
Cholesterol, Total: 190 mg/dL (ref 100–199)
HDL: 78 mg/dL (ref >39)
LDL Chol Calc (NIH): 100 mg/dL — ABNORMAL HIGH (ref 0–99)
Triglycerides: 66 mg/dL (ref 0–149)
VLDL Cholesterol Cal: 12 mg/dL (ref 5–40)

## 2021-02-28 ENCOUNTER — Telehealth: Payer: Self-pay | Admitting: Gastroenterology

## 2021-02-28 DIAGNOSIS — R197 Diarrhea, unspecified: Secondary | ICD-10-CM

## 2021-02-28 DIAGNOSIS — K625 Hemorrhage of anus and rectum: Secondary | ICD-10-CM

## 2021-02-28 NOTE — Telephone Encounter (Signed)
Pt experienced blood in stool recently so she is wondering if she could have a cologuard test done.

## 2021-02-28 NOTE — Telephone Encounter (Signed)
Patient reports she has been blood in her stool that is red like menstrual cycle. Patient states she notices the blood after heavy exercises.Patient does have hemorroids. Patient requesting a cologuard tes. Please advise.

## 2021-03-03 NOTE — Telephone Encounter (Signed)
Cologuard will be positive if she is having rectal bleeding Lets try HC 2.5 % BID after bowel movements x 10 days. Check CBC, CMP If still with bleeding, may require repeat colonoscopy.  RG

## 2021-03-04 NOTE — Telephone Encounter (Signed)
Spoke with patient made her aware of rec's and medication. Pt verbalized understanding,. Labs in [place]. Nothing further at this time.

## 2021-03-14 NOTE — Telephone Encounter (Signed)
Patient called to follow up on previous call states she has not heard from anyone and that she already had provided office with recent labs that were done please call her.

## 2021-03-15 MED ORDER — HYDROCORTISONE (PERIANAL) 2.5 % EX CREA: 1.0000 "application " | TOPICAL_CREAM | Freq: Two times a day (BID) | CUTANEOUS | 1 refills | Status: DC

## 2021-03-15 NOTE — Telephone Encounter (Signed)
I clarified with the patient that she needs repeat labs to confirm not anemic Rx sent for hydrocortisone cream She will come for labs at her convenience

## 2021-03-15 NOTE — Addendum Note (Signed)
Addended by: Marlon Pel on: 03/15/2021 01:01 PM   Modules accepted: Orders

## 2021-03-15 NOTE — Telephone Encounter (Signed)
Patient wants to go to [facility] to have drawn order faxed to [phone]

## 2021-03-15 NOTE — Addendum Note (Signed)
Addended by: Thedore Mins D on: 03/15/2021 03:15 PM   Modules accepted: Orders

## 2021-03-24 ENCOUNTER — Other Ambulatory Visit: Payer: Self-pay

## 2021-03-25 ENCOUNTER — Ambulatory Visit (INDEPENDENT_AMBULATORY_CARE_PROVIDER_SITE_OTHER): Payer: BC Managed Care – PPO | Admitting: Cardiology

## 2021-03-25 ENCOUNTER — Encounter: Payer: Self-pay | Admitting: Cardiology

## 2021-03-25 ENCOUNTER — Other Ambulatory Visit: Payer: Self-pay | Admitting: Gastroenterology

## 2021-03-25 ENCOUNTER — Other Ambulatory Visit: Payer: Self-pay

## 2021-03-25 ENCOUNTER — Telehealth: Payer: Self-pay

## 2021-03-25 VITALS — BP 98/64 | HR 64 | Ht 62.0 in | Wt 102.0 lb

## 2021-03-25 DIAGNOSIS — R197 Diarrhea, unspecified: Secondary | ICD-10-CM | POA: Diagnosis not present

## 2021-03-25 DIAGNOSIS — I2510 Atherosclerotic heart disease of native coronary artery without angina pectoris: Secondary | ICD-10-CM

## 2021-03-25 DIAGNOSIS — K625 Hemorrhage of anus and rectum: Secondary | ICD-10-CM | POA: Diagnosis not present

## 2021-03-25 DIAGNOSIS — E782 Mixed hyperlipidemia: Secondary | ICD-10-CM | POA: Diagnosis not present

## 2021-03-25 NOTE — Progress Notes (Signed)
Cardiology Office Note:    Date:  03/25/2021   ID:  Aunisty Reali, DOB [birth year], MRN [id number]  PCP:  Earlyne Iba, NP  Cardiologist:  Berniece Salines, DO  Electrophysiologist:  None   Referring MD: Earlyne Iba, NP   " I am doing well"  History of Present Illness:    Katelyn Cruz is a 60 y.o. female with a hx of hyperlipidemia on Pravachol, vitamin D deficiency, insomnia, history of breast cancer here today for follow-up visit.  First saw the patient on August 31, 2020 at that time we did discuss her cardiovascular risk stratification and risk factors and will order coronary calcium scoring as she was asymptomatic, we also talked about starting the patient on statin giving her numbers.  In the interim the patient has been started on statin with rosuvastatin and did not tolerate this well therefore she was transitioned to pravastatin.  Her most recent lipid profile did show improvement on her cholesterol panel.  She got a coronary calcium score when show a total calcium of 34.7 putting her at the 59 percentile for age and sex matched.   Past Medical History:  Diagnosis Date   Allergic rhinitis    Anxiety    Arthralgia of multiple sites    Calculus of gallbladder with chronic cholecystitis without obstruction 08/27/2018   Cancer ([place])    Left Breast cancer, 2010   Elevated LFTs 08/27/2018   Elevated liver enzymes    Epigastric pain 08/27/2018   External hemorrhoids    Hyperlipidemia    Insomnia    Low vitamin D level    Malignant neoplasm of upper-outer quadrant of left breast in female, estrogen receptor positive ([place]) 08/01/2016   Osteoporosis    Palpitations    Postoperative examination 11/12/2018   Tibialis posterior tendinitis, right 09/27/2016    Past Surgical History:  Procedure Laterality Date   APPENDECTOMY     CHOLECYSTECTOMY  11/2018   [facility]. Dr Noberto Retort   COLONOSCOPY  03/01/2015   Colonic polyps status post polypectomy. Internal and external hemorrhoids    MASTECTOMY Left    Subtotal   PARTIAL MASTECTOMY WITH AXILLARY SENTINEL LYMPH NODE BIOPSY     TUBAL LIGATION     UMBILICAL HERNIA REPAIR     VAGINAL HYSTERECTOMY  10/24/2011    Current Medications: Current Meds  Medication Sig   Calcium Carbonate-Vitamin D 600-400 MG-UNIT tablet Take 1 tablet by mouth 2 (two) times daily.   Cholecalciferol (VITAMIN D PO) Take 1 tablet by mouth daily.    Cyanocobalamin (VITAMIN B-12 PO) Take 1 tablet by mouth daily.   dicyclomine (BENTYL) 10 MG capsule Take 10 mg by mouth 2 (two) times daily as needed for spasms.   Fiber POWD Take 2 Scoops by mouth daily. Acacia Congo Tummy fiber   pravastatin (PRAVACHOL) 20 MG tablet Take 1 tablet (20 mg total) by mouth every evening.   venlafaxine XR (EFFEXOR-XR) 75 MG 24 hr capsule Take 75 mg by mouth daily.   zaleplon (SONATA) 5 MG capsule Take 1 capsule by mouth daily.     Allergies:   Patient has no known allergies.   Social History   Socioeconomic History   Marital status: Married    Spouse name: Not on file   Number of children: Not on file   Years of education: Not on file   Highest education level: Not on file  Occupational History   Not on file  Tobacco Use   Smoking status: Never  Smokeless tobacco: Never  Vaping Use   Vaping Use: Never used  Substance and Sexual Activity   Alcohol use: Not Currently   Drug use: Never   Sexual activity: Not on file  Other Topics Concern   Not on file  Social History Narrative   Not on file   Social Determinants of Health   Financial Resource Strain: Not on file  Food Insecurity: Not on file  Transportation Needs: Not on file  Physical Activity: Not on file  Stress: Not on file  Social Connections: Not on file     Family History: The patient's family history includes Breast cancer in her mother; Heart disease in her mother; Hypertension in her mother. There is no history of Colon cancer, Colon polyps, Esophageal cancer, Stomach cancer, Rectal  cancer, or Pancreatic cancer.  ROS:   Review of Systems  Constitution: Negative for decreased appetite, fever and weight gain.  HENT: Negative for congestion, ear discharge, hoarse voice and sore throat.   Eyes: Negative for discharge, redness, vision loss in right eye and visual halos.  Cardiovascular: Negative for chest pain, dyspnea on exertion, leg swelling, orthopnea and palpitations.  Respiratory: Negative for cough, hemoptysis, shortness of breath and snoring.   Endocrine: Negative for heat intolerance and polyphagia.  Hematologic/Lymphatic: Negative for bleeding problem. Does not bruise/bleed easily.  Skin: Negative for flushing, nail changes, rash and suspicious lesions.  Musculoskeletal: Negative for arthritis, joint pain, muscle cramps, myalgias, neck pain and stiffness.  Gastrointestinal: Negative for abdominal pain, bowel incontinence, diarrhea and excessive appetite.  Genitourinary: Negative for decreased libido, genital sores and incomplete emptying.  Neurological: Negative for brief paralysis, focal weakness, headaches and loss of balance.  Psychiatric/Behavioral: Negative for altered mental status, depression and suicidal ideas.  Allergic/Immunologic: Negative for HIV exposure and persistent infections.    EKGs/Labs/Other Studies Reviewed:    The following studies were reviewed today:   EKG: None today  Coronary calcium score September 14, 2020 FINDINGS: Coronary arteries: Normal origins.   Coronary Calcium Score:   Left main: 0   Left anterior descending artery: 9.1   Left circumflex artery: 0   Right coronary artery: 25.6   Total: 34.7   Percentile: 84th   Pericardium: Normal.   Ascending Aorta: Normal caliber.   Non-cardiac: See separate report from [facility].   IMPRESSION: Coronary calcium score of 34.7. This was 84th percentile for age and sex matched controls.   Eleonore Chiquito, MD     Electronically Signed   By: Eleonore Chiquito    On: 09/14/2020 12:51    Addended by Geralynn Rile, MD on 09/14/2020  1:00 PM   Study Result  Narrative & Impression  EXAM: OVER-READ INTERPRETATION  CT CHEST   The following report is an over-read performed by radiologist Dr. Aletta Edouard of [facility], [place] on 09/14/2020. This over-read does not include interpretation of cardiac or coronary anatomy or pathology. The coronary calcium score interpretation by the cardiologist is attached.   COMPARISON:  None.   FINDINGS: Vascular: No significant vascular findings. Normal heart size. No pericardial effusion.   Mediastinum/Nodes: Visualized mediastinum and hilar regions demonstrate no lymphadenopathy or masses.   Lungs/Pleura: Visualized lungs show no evidence of pulmonary edema, consolidation, pneumothorax, nodule or pleural fluid.   Upper Abdomen: No acute abnormality.   Musculoskeletal: No chest wall mass or suspicious bone lesions identified.   IMPRESSION: No significant incidental findings.   Electronically Signed: By: Aletta Edouard M.D. On: 09/14/2020 07:44  Recent Labs: 01/20/2021: Magnesium 2.1 02/23/2021: ALT 41; BUN 13; Creatinine, Ser 0.78; Potassium 4.1; Sodium 139  Recent Lipid Panel    Component Value Date/Time   CHOL 190 02/23/2021 0807   TRIG 66 02/23/2021 0807   HDL 78 02/23/2021 0807   CHOLHDL 2.4 02/23/2021 0807   LDLCALC 100 (H) 02/23/2021 0807    Physical Exam:    VS:  BP 98/64   Pulse 64   Ht 5\' 2"  (1.575 m)   Wt 102 lb (46.3 kg)   SpO2 97%   BMI 18.66 kg/m     Wt Readings from Last 3 Encounters:  03/25/21 102 lb (46.3 kg)  02/15/21 101 lb 2 oz (45.9 kg)  08/31/20 103 lb 3.2 oz (46.8 kg)     GEN: Well nourished, well developed in no acute distress HEENT: Normal NECK: No JVD; No carotid bruits LYMPHATICS: No lymphadenopathy CARDIAC: S1S2 noted,RRR, no murmurs, rubs, gallops RESPIRATORY:  Clear to auscultation without rales, wheezing or rhonchi  ABDOMEN:  Soft, non-tender, non-distended, +bowel sounds, no guarding. EXTREMITIES: No edema, No cyanosis, no clubbing MUSCULOSKELETAL:  No deformity  SKIN: Warm and dry NEUROLOGIC:  Alert and oriented x 3, non-focal PSYCHIATRIC:  Normal affect, good insight  ASSESSMENT:    1. Mixed hyperlipidemia   2. Coronary artery calcification seen on CT scan    PLAN:    She is doing well from a cv standpoint. We talked about her recent lipid profile.  She will stay on her Pravachol 20 mg daily.  We talked about her coronary calcification.  She has adapted a better lifestyle with diet and exercise.  The patient is in agreement with the above plan. The patient left the office in stable condition.  The patient will follow up as needed.   Medication Adjustments/Labs and Tests Ordered: Current medicines are reviewed at length with the patient today.  Concerns regarding medicines are outlined above.  No orders of the defined types were placed in this encounter.  No orders of the defined types were placed in this encounter.   Patient Instructions  Medication Instructions:  Your physician recommends that you continue on your current medications as directed. Please refer to the Current Medication list given to you today.  *If you need a refill on your cardiac medications before your next appointment, please call your pharmacy*   Lab Work: None If you have labs (blood work) drawn today and your tests are completely normal, you will receive your results only by: [place] (if you have MyChart) OR A paper copy in the mail If you have any lab test that is abnormal or we need to change your treatment, we will call you to review the results.   Testing/Procedures: None   Follow-Up: At [facility], you and your health needs are our priority.  As part of our continuing mission to provide you with exceptional heart care, we have created designated Provider Care Teams.  These Care Teams include your  primary Cardiologist (physician) and Advanced Practice Providers (APPs -  Physician Assistants and Nurse Practitioners) who all work together to provide you with the care you need, when you need it.  We recommend signing up for the patient portal called "MyChart".  Sign up information is provided on this After Visit Summary.  MyChart is used to connect with patients for Virtual Visits (Telemedicine).  Patients are able to view lab/test results, encounter notes, upcoming appointments, etc.  Non-urgent messages can be sent to your provider as well.  To learn more about what you can do with MyChart, go to [email or web address].    Your next appointment:    As needed   The format for your next appointment:   In Person  Provider:   Berniece Salines, DO [address], [place], [place] [address]    Other Instructions     Adopting a Healthy Lifestyle.  Know what a healthy weight is for you (roughly BMI <25) and aim to maintain this   Aim for 7+ servings of fruits and vegetables daily   65-80+ fluid ounces of water or unsweet tea for healthy kidneys   Limit to max 1 drink of alcohol per day; avoid smoking/tobacco   Limit animal fats in diet for cholesterol and heart health - choose grass fed whenever available   Avoid highly processed foods, and foods high in saturated/trans fats   Aim for low stress - take time to unwind and care for your mental health   Aim for 150 min of moderate intensity exercise weekly for heart health, and weights twice weekly for bone health   Aim for 7-9 hours of sleep daily   When it comes to diets, agreement about the perfect plan isnt easy to find, even among the experts. Experts at the [employer] developed an idea known as the Healthy Eating Plate. Just imagine a plate divided into logical, healthy portions.   The emphasis is on diet quality:   Load up on vegetables and fruits - one-half of your plate: Aim for color and  variety, and remember that potatoes dont count.   Go for whole grains - one-quarter of your plate: Whole wheat, barley, wheat berries, quinoa, oats, brown rice, and foods made with them. If you want pasta, go with whole wheat pasta.   Protein power - one-quarter of your plate: Fish, chicken, beans, and nuts are all healthy, versatile protein sources. Limit red meat.   The diet, however, does go beyond the plate, offering a few other suggestions.   Use healthy plant oils, such as olive, canola, soy, corn, sunflower and peanut. Check the labels, and avoid partially hydrogenated oil, which have unhealthy trans fats.   If youre thirsty, drink water. Coffee and tea are good in moderation, but skip sugary drinks and limit milk and dairy products to one or two daily servings.   The type of carbohydrate in the diet is more important than the amount. Some sources of carbohydrates, such as vegetables, fruits, whole grains, and beans-are healthier than others.   Finally, stay active  Signed, Berniece Salines, DO  03/25/2021 11:00 AM    [place]

## 2021-03-25 NOTE — Patient Instructions (Signed)
Medication Instructions:  Your physician recommends that you continue on your current medications as directed. Please refer to the Current Medication list given to you today.  *If you need a refill on your cardiac medications before your next appointment, please call your pharmacy*   Lab Work: None If you have labs (blood work) drawn today and your tests are completely normal, you will receive your results only by: [place] (if you have MyChart) OR A paper copy in the mail If you have any lab test that is abnormal or we need to change your treatment, we will call you to review the results.   Testing/Procedures: None   Follow-Up: At [facility], you and your health needs are our priority.  As part of our continuing mission to provide you with exceptional heart care, we have created designated Provider Care Teams.  These Care Teams include your primary Cardiologist (physician) and Advanced Practice Providers (APPs -  Physician Assistants and Nurse Practitioners) who all work together to provide you with the care you need, when you need it.  We recommend signing up for the patient portal called "MyChart".  Sign up information is provided on this After Visit Summary.  MyChart is used to connect with patients for Virtual Visits (Telemedicine).  Patients are able to view lab/test results, encounter notes, upcoming appointments, etc.  Non-urgent messages can be sent to your provider as well.   To learn more about what you can do with MyChart, go to [email or web address].    Your next appointment:    As needed   The format for your next appointment:   In Person  Provider:   Berniece Salines, DO [address], [place], [place] [address]    Other Instructions

## 2021-03-25 NOTE — Telephone Encounter (Signed)
Pt called from [facility] stating that they do not have a order for pt to have labs drawn. Lab Order was faxed to [facility] [phone]

## 2021-03-26 LAB — COMPREHENSIVE METABOLIC PANEL
ALT: 48 IU/L — ABNORMAL HIGH (ref 0–32)
AST: 41 IU/L — ABNORMAL HIGH (ref 0–40)
Albumin/Globulin Ratio: 2.1 (ref 1.2–2.2)
Albumin: 4.7 g/dL (ref 3.8–4.9)
Alkaline Phosphatase: 81 IU/L (ref 44–121)
BUN/Creatinine Ratio: 13 (ref 9–23)
BUN: 10 mg/dL (ref 6–24)
Bilirubin Total: 0.7 mg/dL (ref 0.0–1.2)
CO2: 23 mmol/L (ref 20–29)
Calcium: 9.4 mg/dL (ref 8.7–10.2)
Chloride: 102 mmol/L (ref 96–106)
Creatinine, Ser: 0.76 mg/dL (ref 0.57–1.00)
Globulin, Total: 2.2 g/dL (ref 1.5–4.5)
Glucose: 88 mg/dL (ref 65–99)
Potassium: 4.5 mmol/L (ref 3.5–5.2)
Sodium: 142 mmol/L (ref 134–144)
Total Protein: 6.9 g/dL (ref 6.0–8.5)
eGFR: 90 mL/min/{1.73_m2} (ref >59)

## 2021-03-26 LAB — CBC WITH DIFFERENTIAL/PLATELET
Basophils Absolute: 0.1 10*3/uL (ref 0.0–0.2)
Basos: 1 %
EOS (ABSOLUTE): 0 10*3/uL (ref 0.0–0.4)
Eos: 1 %
Hematocrit: 37.1 % (ref 34.0–46.6)
Hemoglobin: 12.4 g/dL (ref 11.1–15.9)
Immature Grans (Abs): 0 10*3/uL (ref 0.0–0.1)
Immature Granulocytes: 0 %
Lymphocytes Absolute: 2 10*3/uL (ref 0.7–3.1)
Lymphs: 37 %
MCH: 29.4 pg (ref 26.6–33.0)
MCHC: 33.4 g/dL (ref 31.5–35.7)
MCV: 88 fL (ref 79–97)
Monocytes Absolute: 0.3 10*3/uL (ref 0.1–0.9)
Monocytes: 6 %
Neutrophils Absolute: 2.9 10*3/uL (ref 1.4–7.0)
Neutrophils: 55 %
Platelets: 295 10*3/uL (ref 150–450)
RBC: 4.22 x10E6/uL (ref 3.77–5.28)
RDW: 12.3 % (ref 11.7–15.4)
WBC: 5.3 10*3/uL (ref 3.4–10.8)

## 2021-03-31 ENCOUNTER — Other Ambulatory Visit: Payer: Self-pay

## 2021-03-31 DIAGNOSIS — R7989 Other specified abnormal findings of blood chemistry: Secondary | ICD-10-CM

## 2021-03-31 NOTE — Telephone Encounter (Signed)
US abdomen Complete ordered and scheduled @ [facility] 10/6/ 2022 @ 10:00 Pt to arrive by 9:45. Pt to be NPO after Midnight. Labs faxed to pt PCP Orlinda Blalock NP,Pt verbalized understanding with all questions answered.

## 2021-03-31 NOTE — Telephone Encounter (Signed)
Patient returning your call in regards to results.

## 2021-04-07 ENCOUNTER — Other Ambulatory Visit: Payer: Self-pay

## 2021-04-07 ENCOUNTER — Ambulatory Visit (HOSPITAL_COMMUNITY)
Admission: RE | Admit: 2021-04-07 | Discharge: 2021-04-07 | Disposition: A | Discharge status: Home / Self Care | Payer: BC Managed Care – PPO | Source: Ambulatory Visit | Attending: Gastroenterology | Admitting: Gastroenterology

## 2021-04-07 DIAGNOSIS — R7989 Other specified abnormal findings of blood chemistry: Secondary | ICD-10-CM | POA: Insufficient documentation

## 2021-04-07 DIAGNOSIS — Z9049 Acquired absence of other specified parts of digestive tract: Secondary | ICD-10-CM | POA: Diagnosis not present

## 2021-04-11 ENCOUNTER — Other Ambulatory Visit: Payer: Self-pay | Admitting: Gastroenterology

## 2021-05-09 DIAGNOSIS — E559 Vitamin D deficiency, unspecified: Secondary | ICD-10-CM | POA: Diagnosis not present

## 2021-05-09 DIAGNOSIS — E785 Hyperlipidemia, unspecified: Secondary | ICD-10-CM | POA: Diagnosis not present

## 2021-05-09 DIAGNOSIS — G4700 Insomnia, unspecified: Secondary | ICD-10-CM | POA: Diagnosis not present

## 2021-05-09 DIAGNOSIS — F419 Anxiety disorder, unspecified: Secondary | ICD-10-CM | POA: Diagnosis not present

## 2021-05-09 DIAGNOSIS — Z862 Personal history of diseases of the blood and blood-forming organs and certain disorders involving the immune mechanism: Secondary | ICD-10-CM | POA: Diagnosis not present

## 2021-05-09 DIAGNOSIS — R7303 Prediabetes: Secondary | ICD-10-CM | POA: Diagnosis not present

## 2021-05-12 ENCOUNTER — Other Ambulatory Visit: Payer: Self-pay

## 2021-05-12 DIAGNOSIS — E782 Mixed hyperlipidemia: Secondary | ICD-10-CM

## 2021-05-12 DIAGNOSIS — R7989 Other specified abnormal findings of blood chemistry: Secondary | ICD-10-CM

## 2021-05-12 MED ORDER — EZETIMIBE 10 MG PO TABS: 10.0000 mg | ORAL_TABLET | Freq: Every day | ORAL | 3 refills | Status: DC

## 2021-05-12 NOTE — Progress Notes (Signed)
Prescription sent to pharmacy. Referral made to lipid clinic.

## 2021-05-13 DIAGNOSIS — M25561 Pain in right knee: Secondary | ICD-10-CM | POA: Diagnosis not present

## 2021-06-17 ENCOUNTER — Other Ambulatory Visit: Payer: Self-pay | Admitting: Gastroenterology

## 2021-06-21 ENCOUNTER — Telehealth: Payer: Self-pay | Admitting: Pharmacist

## 2021-06-21 ENCOUNTER — Other Ambulatory Visit: Payer: Self-pay

## 2021-06-21 ENCOUNTER — Ambulatory Visit (INDEPENDENT_AMBULATORY_CARE_PROVIDER_SITE_OTHER): Payer: BC Managed Care – PPO | Admitting: Pharmacist

## 2021-06-21 VITALS — BP 98/56 | HR 76 | Resp 16 | Ht 62.0 in | Wt 101.0 lb

## 2021-06-21 DIAGNOSIS — E782 Mixed hyperlipidemia: Secondary | ICD-10-CM | POA: Diagnosis not present

## 2021-06-21 DIAGNOSIS — I2510 Atherosclerotic heart disease of native coronary artery without angina pectoris: Secondary | ICD-10-CM | POA: Diagnosis not present

## 2021-06-21 NOTE — Progress Notes (Signed)
Patient ID: Katelyn Cruz                 DOB: 1960-10-06                    MRN: [id number]     HPI: Katelyn Cruz is a 60 y.o. female patient referred to lipid clinic by Dr Harriet Masson. PMH is significant for HLD, elevated coronary calcium score and intolerance to high intensity statins.  Patient last seen by Dr Harriet Masson on 9/23.  LFTs elevated while on rosuvastatin so patient switched to pravastatin 20mg . Unfortunately LDL increased and patient was referred to lipid clinic.  Patient presents today in good spirits.  Is physically active.  Does long distance running and weight exercises. Exercises 5 days a week.  Eats a Vegan diet.  Does not use alcohol or tobacco.  Reports a family history of CAD on her father's side however he Is still living.  Has elevated coronary calcium score.   Current Medications:  Zetia 10mg  daily Pravastatin 20mg  daily  Intolerances:  Rosuvastatin (elevated LFTs)  Risk Factors:  Coronary calcium  HLD  LDL goal: <70  Family History:  CAD in father  Social History: no alcohol no tobacco  Labs: TC 190, Trig 66, HDL 78, LDL 100 (8/24/22_  Coronary calcium score of 34.7. This was 84th percentile for age and sex matched controls.  Past Medical History:  Diagnosis Date   Allergic rhinitis    Anxiety    Arthralgia of multiple sites    Calculus of gallbladder with chronic cholecystitis without obstruction 08/27/2018   Cancer ([place])    Left Breast cancer, 2010   Elevated LFTs 08/27/2018   Elevated liver enzymes    Epigastric pain 08/27/2018   External hemorrhoids    Hyperlipidemia    Insomnia    Low vitamin D level    Malignant neoplasm of upper-outer quadrant of left breast in female, estrogen receptor positive ([place]) 08/01/2016   Osteoporosis    Palpitations    Postoperative examination 11/12/2018   Tibialis posterior tendinitis, right 09/27/2016    Current Outpatient Medications on File Prior to Visit  Medication Sig Dispense Refill   Calcium Carbonate-Vitamin  D 600-400 MG-UNIT tablet Take 1 tablet by mouth 2 (two) times daily.     Cholecalciferol (VITAMIN D PO) Take 1 tablet by mouth daily.      Cyanocobalamin (VITAMIN B-12 PO) Take 1 tablet by mouth daily.     dicyclomine (BENTYL) 10 MG capsule TAKE 1 CAPSULE BY MOUTH TWICE DAILY 60 capsule 6   ezetimibe (ZETIA) 10 MG tablet Take 1 tablet (10 mg total) by mouth daily. 90 tablet 3   Fiber POWD Take 2 Scoops by mouth daily. Acacia Congo Tummy fiber     hydrocortisone (ANUSOL-HC) 2.5 % rectal cream PLACE ONE APPLICATION RECTALLY TWICE DAILY 30 g 1   pravastatin (PRAVACHOL) 20 MG tablet Take 1 tablet (20 mg total) by mouth every evening. 90 tablet 3   venlafaxine XR (EFFEXOR-XR) 75 MG 24 hr capsule Take 75 mg by mouth daily.     zaleplon (SONATA) 5 MG capsule Take 1 capsule by mouth daily.     No current facility-administered medications on file prior to visit.    No Known Allergies  Assessment/Plan:  1. Hyperlipidemia - Patient LDL 100 despite being on statin therapy.  This is above goal of <70.  Due to elevated coronary calcium score, will need PCSK9i to bring patient to goal.  Using demo pen, educated  patient on mechanism of action, storage, site selection, administration, and possible adverse effects.  Patient was able to demonstrate in room and is willing to pursue.  Will complete PA and activate copay card.  Recheck lipid panel in 2-3 months.  If approved, patient can discontinue Zetia since it will likely not reach her to goal. Continue pravastatin. Recheck as needed.  Karren Cobble, PharmD, BCACP, [place], [place], [place], [place], [address] Phone: [phone], Fax: [phone]

## 2021-06-21 NOTE — Telephone Encounter (Signed)
Please complete prior authorization for:  Name of medication, dose, and frequency reptah 140mg  sq q 14 days or praluent 75mg  sq q 14 days  Lab Orders Requested? yes  Which labs? Lipid panel  Estimated date for labs to be scheduled 2-3 months  Does patient need activated copay card? yes

## 2021-06-21 NOTE — Patient Instructions (Signed)
It was nice meeting you today!  We would your LDL to be less than 70  Continue your diet and exercise plan and your pravastatin  We can discontinue your Zetia 10mg  at this time  We will start a new medication called Repatha or Praluent which you will inject once every 2 weeks  We will complete the prior authorization and activate a copay card for you  Once you start the medication we will recheck your cholesterol in 2-3 months  Please call with any questions!  Karren Cobble, PharmD, BCACP, [place], [place], [place], [place], [address] Phone: [phone], Fax: [phone]

## 2021-06-22 MED ORDER — REPATHA SURECLICK 140 MG/ML ~~LOC~~ SOAJ: 140.0000 mg | SUBCUTANEOUS | 11 refills | Status: DC

## 2021-06-22 NOTE — Addendum Note (Signed)
Addended by: Allean Found on: 06/22/2021 03:19 PM   Modules accepted: Orders

## 2021-06-22 NOTE — Telephone Encounter (Signed)
Called and spoke w/pt and stated that they were approved for repatha, rx sent, emailed copay card, instructed the pt to complete fasting labs post 4th dose, pt voiced understanding.

## 2021-06-22 NOTE — Addendum Note (Signed)
Addended by: Allean Found on: 06/22/2021 12:39 PM   Modules accepted: Orders

## 2021-06-22 NOTE — Telephone Encounter (Signed)
Pa sent for repatha. Will call pt and send rx when pa gets approved BRENTLEE SCIARA (Key: [id number]) Repatha SureClick 140MG /ML auto-injectors Lipid panel ordered and released Copay card is as follows: RxBin: [id number] [place]: CN RxGrp: [id number] ID: [id number]

## 2021-06-28 DIAGNOSIS — E611 Iron deficiency: Secondary | ICD-10-CM | POA: Diagnosis not present

## 2021-06-28 DIAGNOSIS — E559 Vitamin D deficiency, unspecified: Secondary | ICD-10-CM | POA: Diagnosis not present

## 2021-06-29 DIAGNOSIS — L820 Inflamed seborrheic keratosis: Secondary | ICD-10-CM | POA: Diagnosis not present

## 2021-08-08 ENCOUNTER — Other Ambulatory Visit: Payer: Self-pay

## 2021-08-08 ENCOUNTER — Ambulatory Visit (INDEPENDENT_AMBULATORY_CARE_PROVIDER_SITE_OTHER): Payer: BC Managed Care – PPO | Admitting: Gastroenterology

## 2021-08-08 ENCOUNTER — Encounter: Payer: Self-pay | Admitting: Gastroenterology

## 2021-08-08 VITALS — BP 104/64 | HR 77 | Ht 61.0 in | Wt 103.5 lb

## 2021-08-08 DIAGNOSIS — R195 Other fecal abnormalities: Secondary | ICD-10-CM | POA: Diagnosis not present

## 2021-08-08 DIAGNOSIS — R197 Diarrhea, unspecified: Secondary | ICD-10-CM

## 2021-08-08 DIAGNOSIS — D509 Iron deficiency anemia, unspecified: Secondary | ICD-10-CM | POA: Diagnosis not present

## 2021-08-08 MED ORDER — CLENPIQ 10-3.5-12 MG-GM -GM/160ML PO SOLN: 1.0000 | ORAL | 0 refills | Status: DC

## 2021-08-08 NOTE — Progress Notes (Signed)
Chief Complaint: FU  Referring Provider:  Earlyne Iba, NP      ASSESSMENT AND PLAN;   #1.  Rectal bleeding with IDA  #2. Diarrhea (resolved after stopping magnesium supplements).  D/d IBS, post-chole diarrhea. R/O other causes. Neg colon with random colonic bx for microscopic colitis 01/2018.  However, previously in 2016, biopsies were positive for microscopic colitis.  Failed cholestyamine and equalactin. Failed lomotil. Neg stool studies including C. Diff, WBCs, culture, fecal elastase.  Responded very well to mesalamine in the past. Neg celiac screen. Failed lomotil.   Plan:  - Colon 2 day prep - Records from [facility] CBC, iron studies - Stop iron 7 days before colon.     Discussed risks & benefits of colonoscopy. Risks including rare perforation req laparotomy, bleeding after bx/polypectomy req blood transfusion, rarely missing neoplasms, risks of anesthesia/sedation, rare risk of damage to internal organs. Benefits outweigh the risks. Patient agrees to proceed. All the questions were answered. Pt consents to proceed.       HPI:    Katelyn Cruz is a 61 y.o. female  For follow-up visit  Has been having rectal bleeding especially when after running long distance.  She tried hydrocortisone cream PR 2.5% without any relief.  No further diarrhea ever since she has stopped taking magnesium supplements.  She was found to be anemic with low ferritin-the reports are awaited-has been started on iron supplements.  She has been told to get repeat colonoscopy as previous colonoscopy was limited due to quality of preparation.  No nausea, vomiting, heartburn, regurgitation, odynophagia or dysphagia.  No significant constipation.  No melena or hematochezia. No unintentional weight loss. No abdominal pain.  No bloating.  No fever chills or night sweats.  Past GI work-up: [place] abdo d/t borderline LFTs 04/2021 1. Surgical changes of prior cholecystectomy without evidence  of intra or extrahepatic biliary ductal dilatation. 2. Otherwise, unremarkable abdominal ultrasound.  Colonoscopy 01/2018: Limited due to quality of preparation - Non-bleeding internal hemorrhoids. - The examination was otherwise normal on direct and retroflexion views. - Bx: Negative for microscopic colitis.  Past Medical History:  Diagnosis Date   Allergic rhinitis    Anxiety    Arthralgia of multiple sites    Calculus of gallbladder with chronic cholecystitis without obstruction 08/27/2018   Cancer ([place])    Left Breast cancer, 2010   Elevated LFTs 08/27/2018   Elevated liver enzymes    Epigastric pain 08/27/2018   External hemorrhoids    Hyperlipidemia    Insomnia    Low vitamin D level    Malignant neoplasm of upper-outer quadrant of left breast in female, estrogen receptor positive ([place]) 08/01/2016   Osteoporosis    Palpitations    Postoperative examination 11/12/2018   Tibialis posterior tendinitis, right 09/27/2016    Past Surgical History:  Procedure Laterality Date   APPENDECTOMY     CHOLECYSTECTOMY  11/2018   [facility]. Dr Noberto Retort   COLONOSCOPY  03/01/2015   Colonic polyps status post polypectomy. Internal and external hemorrhoids   MASTECTOMY Left    Subtotal   PARTIAL MASTECTOMY WITH AXILLARY SENTINEL LYMPH NODE BIOPSY     TUBAL LIGATION     UMBILICAL HERNIA REPAIR     VAGINAL HYSTERECTOMY  10/24/2011    Family History  Problem Relation Age of Onset   Heart disease Mother    Hypertension Mother    Breast cancer Mother    Colon cancer Neg Hx    Colon polyps Neg  Hx    Esophageal cancer Neg Hx    Stomach cancer Neg Hx    Rectal cancer Neg Hx    Pancreatic cancer Neg Hx     Social History   Tobacco Use   Smoking status: Never   Smokeless tobacco: Never  Vaping Use   Vaping Use: Never used  Substance Use Topics   Alcohol use: Not Currently   Drug use: Never    Current Outpatient Medications  Medication Sig Dispense Refill   Calcium  Carbonate-Vitamin D 600-400 MG-UNIT tablet Take 1 tablet by mouth 2 (two) times daily.     Cholecalciferol (VITAMIN D PO) Take 1 tablet by mouth daily.      Cyanocobalamin (VITAMIN B-12 PO) Take 1 tablet by mouth daily.     dicyclomine (BENTYL) 10 MG capsule TAKE 1 CAPSULE BY MOUTH TWICE DAILY 60 capsule 6   Evolocumab (REPATHA SURECLICK) 157 MG/ML SOAJ Inject 140 mg into the skin every 14 (fourteen) days. 2 mL 11   Fiber POWD Take 2 Scoops by mouth daily. Acacia Congo Tummy fiber     venlafaxine XR (EFFEXOR-XR) 75 MG 24 hr capsule Take 75 mg by mouth daily.     zaleplon (SONATA) 5 MG capsule Take 1 capsule by mouth daily.     pravastatin (PRAVACHOL) 20 MG tablet Take 1 tablet (20 mg total) by mouth every evening. 90 tablet 3   No current facility-administered medications for this visit.    No Known Allergies  Review of Systems:  neg     Physical Exam:    BP 104/64    Pulse 77    Ht 5\' 1"  (1.549 m)    Wt 103 lb 8 oz (46.9 kg)    SpO2 99%    BMI 19.56 kg/m  Wt Readings from Last 3 Encounters:  08/08/21 103 lb 8 oz (46.9 kg)  06/21/21 101 lb (45.8 kg)  03/25/21 102 lb (46.3 kg)   Gen: awake, alert, NAD HEENT: anicteric, no pallor CV: RRR, no mrg Pulm: CTA b/l Abd: soft, NT/ND, +BS throughout Rectal examination-to be performed at the time of colonoscopy Ext: no c/c/e Neuro: nonfocal   Katelyn Austria, MD 08/08/2021, 4:08 PM  Cc: Katelyn Iba, NP

## 2021-08-08 NOTE — Patient Instructions (Addendum)
If you are age 60 or younger, your body mass index should be between 19-25. Your Body mass index is 19.56 kg/m. If this is out of the aformentioned range listed, please consider follow up with your Primary Care Provider.   __________________________________________________________  The [place] [facility] providers would like to encourage you to use [facility] to communicate with providers for non-urgent requests or questions.  Due to long hold times on the telephone, sending your provider a message by [facility] ([facility]) may be a faster and more efficient way to get a response.  Please allow 48 business hours for a response.  Please remember that this is for non-urgent requests.    Due to recent changes in healthcare laws, you may see the results of your imaging and laboratory studies on MyChart before your provider has had a chance to review them.  We understand that in some cases there may be results that are confusing or concerning to you. Not all laboratory results come back in the same time frame and the provider may be waiting for multiple results in order to interpret others.  Please give [place] 48 hours in order for your provider to thoroughly review all the results before contacting the office for clarification of your results.   We have sent the following medications to your pharmacy for you to pick up at your convenience: Clenpiq  You have been scheduled for a colonoscopy. Please follow written instructions given to you at your visit today.  Please pick up your prep supplies at the pharmacy within the next 1-3 days. If you use inhalers (even only as needed), please bring them with you on the day of your procedure.   Stop Iron for 7 days before colonoscopy.  It was a pleasure to see you today!  Jackquline Denmark, M.D.

## 2021-08-09 ENCOUNTER — Telehealth: Payer: Self-pay | Admitting: Gastroenterology

## 2021-08-09 NOTE — Telephone Encounter (Signed)
Spoke with the patient. Informed her that we do have a sample of clenpiq that we can provide her for prep States she is coming on Friday to pick it up.

## 2021-08-09 NOTE — Telephone Encounter (Signed)
Inbound call from patient stating Clenpiq is over $200 and is requesting something more affordable to be sent to the pharmacy.  Please advise.

## 2021-08-24 DIAGNOSIS — Z1231 Encounter for screening mammogram for malignant neoplasm of breast: Secondary | ICD-10-CM | POA: Diagnosis not present

## 2021-08-31 ENCOUNTER — Encounter: Payer: Self-pay | Admitting: Gastroenterology

## 2021-09-06 DIAGNOSIS — E782 Mixed hyperlipidemia: Secondary | ICD-10-CM | POA: Diagnosis not present

## 2021-09-06 LAB — LIPID PANEL
Chol/HDL Ratio: 1.6 ratio (ref 0.0–4.4)
Cholesterol, Total: 113 mg/dL (ref 100–199)
HDL: 69 mg/dL (ref >39)
LDL Chol Calc (NIH): 31 mg/dL (ref 0–99)
Triglycerides: 59 mg/dL (ref 0–149)
VLDL Cholesterol Cal: 13 mg/dL (ref 5–40)

## 2021-09-14 ENCOUNTER — Ambulatory Visit (AMBULATORY_SURGERY_CENTER): Payer: BC Managed Care – PPO | Admitting: Gastroenterology

## 2021-09-14 ENCOUNTER — Encounter: Payer: Self-pay | Admitting: Gastroenterology

## 2021-09-14 ENCOUNTER — Other Ambulatory Visit (INDEPENDENT_AMBULATORY_CARE_PROVIDER_SITE_OTHER): Payer: BC Managed Care – PPO

## 2021-09-14 ENCOUNTER — Other Ambulatory Visit: Payer: Self-pay

## 2021-09-14 VITALS — BP 110/55 | HR 66 | Temp 96.9°F | Resp 12 | Ht 61.0 in | Wt 103.0 lb

## 2021-09-14 DIAGNOSIS — R197 Diarrhea, unspecified: Secondary | ICD-10-CM

## 2021-09-14 DIAGNOSIS — D509 Iron deficiency anemia, unspecified: Secondary | ICD-10-CM

## 2021-09-14 DIAGNOSIS — K640 First degree hemorrhoids: Secondary | ICD-10-CM | POA: Diagnosis not present

## 2021-09-14 DIAGNOSIS — R195 Other fecal abnormalities: Secondary | ICD-10-CM

## 2021-09-14 HISTORY — PX: COLONOSCOPY WITH PROPOFOL: SHX5780

## 2021-09-14 LAB — COMPREHENSIVE METABOLIC PANEL
ALT: 30 U/L (ref 0–35)
AST: 33 U/L (ref 0–37)
Albumin: 4.1 g/dL (ref 3.5–5.2)
Alkaline Phosphatase: 61 U/L (ref 39–117)
BUN: 8 mg/dL (ref 6–23)
CO2: 31 mEq/L (ref 19–32)
Calcium: 9.2 mg/dL (ref 8.4–10.5)
Chloride: 100 mEq/L (ref 96–112)
Creatinine, Ser: 0.71 mg/dL (ref 0.40–1.20)
GFR: 93.01 mL/min (ref >60.00)
Glucose, Bld: 79 mg/dL (ref 70–99)
Potassium: 4.3 mEq/L (ref 3.5–5.1)
Sodium: 138 mEq/L (ref 135–145)
Total Bilirubin: 0.9 mg/dL (ref 0.2–1.2)
Total Protein: 6.4 g/dL (ref 6.0–8.3)

## 2021-09-14 LAB — CBC WITH DIFFERENTIAL/PLATELET
Basophils Absolute: 0 10*3/uL (ref 0.0–0.1)
Basophils Relative: 0.8 % (ref 0.0–3.0)
Eosinophils Absolute: 0 10*3/uL (ref 0.0–0.7)
Eosinophils Relative: 0.3 % (ref 0.0–5.0)
HCT: 37.4 % (ref 36.0–46.0)
Hemoglobin: 12.7 g/dL (ref 12.0–15.0)
Lymphocytes Relative: 32.9 % (ref 12.0–46.0)
Lymphs Abs: 1.4 10*3/uL (ref 0.7–4.0)
MCHC: 34.1 g/dL (ref 30.0–36.0)
MCV: 87.8 fl (ref 78.0–100.0)
Monocytes Absolute: 0.3 10*3/uL (ref 0.1–1.0)
Monocytes Relative: 6.2 % (ref 3.0–12.0)
Neutro Abs: 2.5 10*3/uL (ref 1.4–7.7)
Neutrophils Relative %: 59.8 % (ref 43.0–77.0)
Platelets: 311 10*3/uL (ref 150.0–400.0)
RBC: 4.26 Mil/uL (ref 3.87–5.11)
RDW: 13.7 % (ref 11.5–15.5)
WBC: 4.1 10*3/uL (ref 4.0–10.5)

## 2021-09-14 MED ORDER — SODIUM CHLORIDE 0.9 % IV SOLN: 500.0000 mL | Freq: Once | INTRAVENOUS | Status: DC

## 2021-09-14 NOTE — Op Note (Addendum)
[place] ?Patient Name: Katelyn Cruz ?Procedure Date: 09/14/2021 9:28 AM ?MRN: [id number] ?Endoscopist: Jackquline Denmark , MD ?Age: 61 ?Referring MD:  ?Date of Birth: 09-05-1960 ?Gender: Female ?Account #: [id number] ?Procedure:                Colonoscopy ?Indications:              Iron deficiency anemia. H+ stools ?Medicines:                Monitored Anesthesia Care ?Procedure:                Pre-Anesthesia Assessment: ?                          - Prior to the procedure, a History and Physical  ?                          was performed, and patient medications and  ?                          allergies were reviewed. The patient's tolerance of  ?                          previous anesthesia was also reviewed. The risks  ?                          and benefits of the procedure and the sedation  ?                          options and risks were discussed with the patient.  ?                          All questions were answered, and informed consent  ?                          was obtained. Prior Anticoagulants: The patient has  ?                          taken no previous anticoagulant or antiplatelet  ?                          agents. ASA Grade Assessment: II - A patient with  ?                          mild systemic disease. After reviewing the risks  ?                          and benefits, the patient was deemed in  ?                          satisfactory condition to undergo the procedure. ?                          After obtaining informed consent, the colonoscope  ?  was passed under direct vision. Throughout the  ?                          procedure, the patient's blood pressure, pulse, and  ?                          oxygen saturations were monitored continuously. The  ?                          Olympus PCF-H190DL (#[id number]) Colonoscope was  ?                          introduced through the anus and advanced to the 2  ?                          cm into the ileum. The  colonoscopy was performed  ?                          without difficulty. The patient tolerated the  ?                          procedure well. The quality of the bowel  ?                          preparation was good. The terminal ileum, ileocecal  ?                          valve, appendiceal orifice, and rectum were  ?                          photographed. ?Scope In: 9:33:47 AM ?Scope Out: 9:55:03 AM ?Scope Withdrawal Time: 0 hours 12 minutes 1 second  ?Total Procedure Duration: 0 hours 21 minutes 16 seconds  ?Findings:                 The colon (entire examined portion) appeared  ?                          normal. The colon was highly torturous. ?                          Non-bleeding internal hemorrhoids were found during  ?                          retroflexion. The hemorrhoids were small and Grade  ?                          I (internal hemorrhoids that do not prolapse). ?                          The terminal ileum appeared normal. ?                          The exam was otherwise without abnormality on  ?  direct and retroflexion views. ?Complications:            No immediate complications. ?Estimated Blood Loss:     Estimated blood loss: none. ?Impression:               - The entire examined colon is normal. ?                          - Non-bleeding internal hemorrhoids. ?                          - The examined portion of the ileum was normal. ?                          - The examination was otherwise normal on direct  ?                          and retroflexion views. ?                          - No specimens collected. ?Recommendation:           - Patient has a contact number available for  ?                          emergencies. The signs and symptoms of potential  ?                          delayed complications were discussed with the  ?                          patient. Return to normal activities tomorrow.  ?                          Written discharge instructions were  provided to the  ?                          patient. ?                          - Resume previous diet. ?                          - Continue present medications. ?                          - Repeat colonoscopy in 10 years for screening  ?                          purposes. Earlier, if with any new problems or  ?                          change in family history. ?                          - Check CBC and CMP today. ?                          -  If still with anemia, would perform further  ?                          work-up ?                          - The findings and recommendations were discussed  ?                          with the patient's family. ?Jackquline Denmark, MD ?09/14/2021 10:01:54 AM ?This report has been signed electronically. ?

## 2021-09-14 NOTE — Progress Notes (Signed)
? ? ?Chief Complaint: FU ? ?Referring Provider:  Earlyne Iba, NP    ? ? ?ASSESSMENT AND PLAN;  ? ?#1.  Rectal bleeding with IDA ? ?#2. Diarrhea (resolved after stopping magnesium supplements).  D/d IBS, post-chole diarrhea. R/O other causes. Neg colon with random colonic bx for microscopic colitis 01/2018.  However, previously in 2016, biopsies were positive for microscopic colitis.  Failed cholestyamine and equalactin. Failed lomotil. Neg stool studies including C. Diff, WBCs, culture, fecal elastase.  Responded very well to mesalamine in the past. Neg celiac screen. Failed lomotil. ? ? ?Plan: ? ?- Colon 2 day prep ?- Records from [facility] CBC, iron studies ?- Stop iron 7 days before colon. ? ? ? ? ?Discussed risks & benefits of colonoscopy. Risks including rare perforation req laparotomy, bleeding after bx/polypectomy req blood transfusion, rarely missing neoplasms, risks of anesthesia/sedation, rare risk of damage to internal organs. Benefits outweigh the risks. Patient agrees to proceed. All the questions were answered. Pt consents to proceed. ? ? ? ? ? ? ?HPI:   ? ?Katelyn Cruz is a 61 y.o. female  ?For follow-up visit ? ?Has been having rectal bleeding especially when after running long distance.  She tried hydrocortisone cream PR 2.5% without any relief.  No further diarrhea ever since she has stopped taking magnesium supplements.  She was found to be anemic with low ferritin-the reports are awaited-has been started on iron supplements. ? ?She has been told to get repeat colonoscopy as previous colonoscopy was limited due to quality of preparation. ? ?No nausea, vomiting, heartburn, regurgitation, odynophagia or dysphagia.  No significant constipation.  No melena or hematochezia. No unintentional weight loss. No abdominal pain. ? ?No bloating. ? ?No fever chills or night sweats. ? ?Past GI work-up: ?[place] abdo d/t borderline LFTs 04/2021 ?1. Surgical changes of prior cholecystectomy without evidence  of ?intra or extrahepatic biliary ductal dilatation. ?2. Otherwise, unremarkable abdominal ultrasound. ? ?Colonoscopy 01/2018: Limited due to quality of preparation ?- Non-bleeding internal hemorrhoids. ?- The examination was otherwise normal on direct and retroflexion views. ?- Bx: Negative for microscopic colitis. ? ?Past Medical History:  ?Diagnosis Date  ? Allergic rhinitis   ? Anemia   ? Anxiety   ? Arthralgia of multiple sites   ? Calculus of gallbladder with chronic cholecystitis without obstruction 08/27/2018  ? Cancer [facility])   ? Left Breast cancer, 2010  ? Elevated LFTs 08/27/2018  ? Elevated liver enzymes   ? Epigastric pain 08/27/2018  ? External hemorrhoids   ? Hyperlipidemia   ? Insomnia   ? Low vitamin D level   ? Malignant neoplasm of upper-outer quadrant of left breast in female, estrogen receptor positive ([place]) 08/01/2016  ? Osteoporosis   ? Palpitations   ? Postoperative examination 11/12/2018  ? Tibialis posterior tendinitis, right 09/27/2016  ? ? ?Past Surgical History:  ?Procedure Laterality Date  ? APPENDECTOMY    ? CHOLECYSTECTOMY  11/2018  ? [facility]. Dr Noberto Retort  ? COLONOSCOPY  03/01/2015  ? Colonic polyps status post polypectomy. Internal and external hemorrhoids  ? COLONOSCOPY WITH PROPOFOL  09/14/2021  ? MASTECTOMY Left   ? Subtotal  ? PARTIAL MASTECTOMY WITH AXILLARY SENTINEL LYMPH NODE BIOPSY    ? TUBAL LIGATION    ? UMBILICAL HERNIA REPAIR    ? VAGINAL HYSTERECTOMY  10/24/2011  ? ? ?Family History  ?Problem Relation Age of Onset  ? Heart disease Mother   ? Hypertension Mother   ? Breast cancer Mother   ?  Colon cancer Neg Hx   ? Colon polyps Neg Hx   ? Esophageal cancer Neg Hx   ? Stomach cancer Neg Hx   ? Rectal cancer Neg Hx   ? Pancreatic cancer Neg Hx   ? ? ?Social History  ? ?Tobacco Use  ? Smoking status: Never  ? Smokeless tobacco: Never  ?Vaping Use  ? Vaping Use: Never used  ?Substance Use Topics  ? Alcohol use: Not Currently  ? Drug use: Never  ? ? ?Current Outpatient  Medications  ?Medication Sig Dispense Refill  ? Calcium Carbonate-Vitamin D 600-400 MG-UNIT tablet Take 1 tablet by mouth 2 (two) times daily.    ? Cholecalciferol (VITAMIN D PO) Take 1 tablet by mouth daily.     ? Cyanocobalamin (VITAMIN B-12 PO) Take 1 tablet by mouth daily.    ? pravastatin (PRAVACHOL) 20 MG tablet Take 1 tablet (20 mg total) by mouth every evening. 90 tablet 3  ? venlafaxine XR (EFFEXOR-XR) 75 MG 24 hr capsule Take 75 mg by mouth daily.    ? zaleplon (SONATA) 5 MG capsule Take 1 capsule by mouth daily.    ? dicyclomine (BENTYL) 10 MG capsule TAKE 1 CAPSULE BY MOUTH TWICE DAILY 60 capsule 6  ? Evolocumab (REPATHA SURECLICK) 779 MG/ML SOAJ Inject 140 mg into the skin every 14 (fourteen) days. 2 mL 11  ? Fiber POWD Take 2 Scoops by mouth daily. Acacia Congo Tummy fiber    ? ?Current Facility-Administered Medications  ?Medication Dose Route Frequency Provider Last Rate Last Admin  ? 0.9 %  sodium chloride infusion  500 mL Intravenous Once Jackquline Denmark, MD      ? ? ?No Known Allergies ? ?Review of Systems:  ?neg ? ?  ? ?Physical Exam:   ? ?BP (!) 108/48   Pulse 72   Temp (!) 96.9 ?F (36.1 ?C) (Temporal)   Ht '5\' 1"'$  (1.549 m)   Wt 103 lb (46.7 kg)   SpO2 100%   BMI 19.46 kg/m?  ?Wt Readings from Last 3 Encounters:  ?09/14/21 103 lb (46.7 kg)  ?08/08/21 103 lb 8 oz (46.9 kg)  ?06/21/21 101 lb (45.8 kg)  ? ?Gen: awake, alert, NAD ?HEENT: anicteric, no pallor ?CV: RRR, no mrg ?Pulm: CTA b/l ?Abd: soft, NT/ND, +BS throughout ?Rectal examination-to be performed at the time of colonoscopy ?Ext: no c/c/e ?Neuro: nonfocal ? ? ?Carmell Austria, MD 09/14/2021, 9:23 AM ? ?Cc: Earlyne Iba, NP ? ? ?

## 2021-09-14 NOTE — Progress Notes (Signed)
Pt's states no medical or surgical changes since previsit or office visit.  ° °VS DT °

## 2021-09-14 NOTE — Patient Instructions (Addendum)
? ? ?Patient to go to lab for bloodwork prior to discharge today ? ? ?Handout on hemorrhoids given to you today ? ? ?YOU HAD AN ENDOSCOPIC PROCEDURE TODAY AT [facility]:   Refer to the procedure report that was given to you for any specific questions about what was found during the examination.  If the procedure report does not answer your questions, please call your gastroenterologist to clarify.  If you requested that your care partner not be given the details of your procedure findings, then the procedure report has been included in a sealed envelope for you to review at your convenience later. ? ?YOU SHOULD EXPECT: Some feelings of bloating in the abdomen. Passage of more gas than usual.  Walking can help get rid of the air that was put into your GI tract during the procedure and reduce the bloating. If you had a lower endoscopy (such as a colonoscopy or flexible sigmoidoscopy) you may notice spotting of blood in your stool or on the toilet paper. If you underwent a bowel prep for your procedure, you may not have a normal bowel movement for a few days. ? ?Please Note:  You might notice some irritation and congestion in your nose or some drainage.  This is from the oxygen used during your procedure.  There is no need for concern and it should clear up in a day or so. ? ?SYMPTOMS TO REPORT IMMEDIATELY: ? ?Following lower endoscopy (colonoscopy or flexible sigmoidoscopy): ? Excessive amounts of blood in the stool ? Significant tenderness or worsening of abdominal pains ? Swelling of the abdomen that is new, acute ? Fever of 100?F or higher ? ? ?For urgent or emergent issues, a gastroenterologist can be reached at any hour by calling [phone]. ?Do not use MyChart messaging for urgent concerns.  ? ? ?DIET:  We do recommend a small meal at first, but then you may proceed to your regular diet.  Drink plenty of fluids but you should avoid alcoholic beverages for 24 hours. ? ?ACTIVITY:  You  should plan to take it easy for the rest of today and you should NOT DRIVE or use heavy machinery until tomorrow (because of the sedation medicines used during the test).   ? ?FOLLOW UP: ?Our staff will call the number listed on your records 48-72 hours following your procedure to check on you and address any questions or concerns that you may have regarding the information given to you following your procedure. If we do not reach you, we will leave a message.  We will attempt to reach you two times.  During this call, we will ask if you have developed any symptoms of COVID 19. If you develop any symptoms (ie: fever, flu-like symptoms, shortness of breath, cough etc.) before then, please call [phone].  If you test positive for Covid 19 in the 2 weeks post procedure, please call and report this information to [place].   ? ?If any biopsies were taken you will be contacted by phone or by letter within the next 1-3 weeks.  Please call us at [phone] if you have not heard about the biopsies in 3 weeks.  ? ? ?SIGNATURES/CONFIDENTIALITY: ?You and/or your care partner have signed paperwork which will be entered into your electronic medical record.  These signatures attest to the fact that that the information above on your After Visit Summary has been reviewed and is understood.  Full responsibility of the confidentiality of this discharge information lies  with you and/or your care-partner.  ? ? ?

## 2021-09-14 NOTE — Progress Notes (Signed)
To Pacu, VSS. Report to [email or web address] 

## 2021-09-16 ENCOUNTER — Telehealth: Payer: Self-pay | Admitting: *Deleted

## 2021-09-16 NOTE — Telephone Encounter (Signed)
?  Follow up Call- ? ?Call back number 09/14/2021  ?Post procedure Call Back phone  # [phone]  ?Permission to leave phone message Yes  ?Some recent data might be hidden  ?  ? ?Patient questions: ? ?Do you have a fever, pain , or abdominal swelling? No. ?Pain Score  0 * ? ?Have you tolerated food without any problems? Yes.   ? ?Have you been able to return to your normal activities? Yes.   ? ?Do you have any questions about your discharge instructions: ?Diet   No. ?Medications  No. ?Follow up visit  No. ? ?Do you have questions or concerns about your Care? No. ? ?Actions: ?* If pain score is 4 or above: ?No action needed, pain <4. ? ? ?

## 2021-09-20 NOTE — Telephone Encounter (Signed)
That is exactly what it looks like ?Can use Preparation H BID x 3-4 days if you notice any blood ?RG ?

## 2021-11-10 DIAGNOSIS — E559 Vitamin D deficiency, unspecified: Secondary | ICD-10-CM | POA: Diagnosis not present

## 2021-11-10 DIAGNOSIS — Z862 Personal history of diseases of the blood and blood-forming organs and certain disorders involving the immune mechanism: Secondary | ICD-10-CM | POA: Diagnosis not present

## 2021-11-10 DIAGNOSIS — F419 Anxiety disorder, unspecified: Secondary | ICD-10-CM | POA: Diagnosis not present

## 2021-11-10 DIAGNOSIS — G4700 Insomnia, unspecified: Secondary | ICD-10-CM | POA: Diagnosis not present

## 2021-11-10 DIAGNOSIS — R7303 Prediabetes: Secondary | ICD-10-CM | POA: Diagnosis not present

## 2021-11-10 DIAGNOSIS — E785 Hyperlipidemia, unspecified: Secondary | ICD-10-CM | POA: Diagnosis not present

## 2021-11-14 DIAGNOSIS — D225 Melanocytic nevi of trunk: Secondary | ICD-10-CM | POA: Diagnosis not present

## 2021-11-14 DIAGNOSIS — L578 Other skin changes due to chronic exposure to nonionizing radiation: Secondary | ICD-10-CM | POA: Diagnosis not present

## 2021-11-14 DIAGNOSIS — L570 Actinic keratosis: Secondary | ICD-10-CM | POA: Diagnosis not present

## 2021-11-14 DIAGNOSIS — L821 Other seborrheic keratosis: Secondary | ICD-10-CM | POA: Diagnosis not present

## 2021-11-30 ENCOUNTER — Encounter: Payer: Self-pay | Admitting: Cardiology

## 2021-12-06 DIAGNOSIS — L300 Nummular dermatitis: Secondary | ICD-10-CM | POA: Diagnosis not present

## 2021-12-06 DIAGNOSIS — L299 Pruritus, unspecified: Secondary | ICD-10-CM | POA: Diagnosis not present

## 2022-01-02 ENCOUNTER — Other Ambulatory Visit: Payer: Self-pay | Admitting: Cardiology

## 2022-01-10 DIAGNOSIS — L299 Pruritus, unspecified: Secondary | ICD-10-CM | POA: Diagnosis not present

## 2022-01-10 DIAGNOSIS — L300 Nummular dermatitis: Secondary | ICD-10-CM | POA: Diagnosis not present

## 2022-04-10 ENCOUNTER — Other Ambulatory Visit: Payer: Self-pay

## 2022-04-11 MED ORDER — PRAVASTATIN SODIUM 20 MG PO TABS: 20.0000 mg | ORAL_TABLET | Freq: Every evening | ORAL | 0 refills | Status: DC

## 2022-05-12 ENCOUNTER — Other Ambulatory Visit: Payer: Self-pay | Admitting: Pharmacist

## 2022-05-12 MED ORDER — REPATHA SURECLICK 140 MG/ML ~~LOC~~ SOAJ: 140.0000 mg | SUBCUTANEOUS | 11 refills | Status: DC

## 2022-05-15 ENCOUNTER — Other Ambulatory Visit: Payer: Self-pay

## 2022-05-15 DIAGNOSIS — F419 Anxiety disorder, unspecified: Secondary | ICD-10-CM | POA: Diagnosis not present

## 2022-05-15 DIAGNOSIS — Z681 Body mass index (BMI) 19 or less, adult: Secondary | ICD-10-CM | POA: Diagnosis not present

## 2022-05-15 DIAGNOSIS — G4700 Insomnia, unspecified: Secondary | ICD-10-CM | POA: Diagnosis not present

## 2022-05-15 DIAGNOSIS — E785 Hyperlipidemia, unspecified: Secondary | ICD-10-CM | POA: Diagnosis not present

## 2022-05-15 DIAGNOSIS — E559 Vitamin D deficiency, unspecified: Secondary | ICD-10-CM | POA: Diagnosis not present

## 2022-05-15 MED ORDER — PRAVASTATIN SODIUM 20 MG PO TABS
20.0000 mg | ORAL_TABLET | Freq: Every evening | ORAL | 0 refills | Status: DC
Start: 2022-05-15 — End: 2022-06-09

## 2022-06-09 ENCOUNTER — Telehealth: Payer: Self-pay | Admitting: Cardiology

## 2022-06-09 MED ORDER — PRAVASTATIN SODIUM 20 MG PO TABS
20.0000 mg | ORAL_TABLET | Freq: Every evening | ORAL | 0 refills | Status: DC
Start: 2022-06-09 — End: 2022-06-16

## 2022-06-09 NOTE — Telephone Encounter (Signed)
*  STAT* If patient is at the pharmacy, call can be transferred to refill team.   1. Which medications need to be refilled? (please list name of each medication and dose if known)   pravastatin (PRAVACHOL) 20 MG tablet    2. Which pharmacy/location (including street and city if local pharmacy) is medication to be sent to? [place], [place]   3. Do they need a 30 day or 90 day supply?  30 day  Pt has scheduled appt on 06/16/22

## 2022-06-15 NOTE — Progress Notes (Signed)
Cardiology Clinic Note   Patient Name: Katelyn Cruz Date of Encounter: 06/15/2022  Primary Care Provider:  Earlyne Iba, NP Primary Cardiologist:  Berniece Salines, DO  Patient Profile    61 year old female with history of hyperlipidemia on Pravachol, vitamin D deficiency, insomnia, history of breast cancer. Coronary Calcium Score on 03/25/2021 34.7 65 percentile for age and sex matched controls. Last seen by Dr. Mechele Claude on 03/25/2021 and was doing well, and was without complaint.   Past Medical History    Past Medical History:  Diagnosis Date   Allergic rhinitis    Anemia    Anxiety    Arthralgia of multiple sites    Calculus of gallbladder with chronic cholecystitis without obstruction 08/27/2018   Cancer ([place])    Left Breast cancer, 2010   Elevated LFTs 08/27/2018   Elevated liver enzymes    Epigastric pain 08/27/2018   External hemorrhoids    Hyperlipidemia    Insomnia    Low vitamin D level    Malignant neoplasm of upper-outer quadrant of left breast in female, estrogen receptor positive ([place]) 08/01/2016   Osteoporosis    Palpitations    Postoperative examination 11/12/2018   Tibialis posterior tendinitis, right 09/27/2016   Past Surgical History:  Procedure Laterality Date   APPENDECTOMY     CHOLECYSTECTOMY  11/2018   [facility]. Dr Noberto Retort   COLONOSCOPY  03/01/2015   Colonic polyps status post polypectomy. Internal and external hemorrhoids   COLONOSCOPY WITH PROPOFOL  09/14/2021   MASTECTOMY Left    [employer]   PARTIAL MASTECTOMY WITH AXILLARY SENTINEL LYMPH NODE BIOPSY     TUBAL LIGATION     UMBILICAL HERNIA REPAIR     VAGINAL HYSTERECTOMY  10/24/2011    Allergies  No Known Allergies  History of Present Illness    Katelyn Cruz is a 61 year old female patient of Dr. Mechele Claude with history of HL and mild coronary calcification on cardiac calcium score in 09/14/2020, with score of 34.7. Maximum calcium is seen in RCA of 25.6. She is here for annual follow up.  Most recent lipid panel on 09/03/2021 LDL 31, TC, 113, TG 59, HDL 69.   Katelyn Cruz comes today without any cardiac complaint.  She continues to run marathons internationally and locally, and also runs approximately 30 miles a week.  She remains medically compliant and has no issues with myalgia pain or chest discomfort.  She is in need of refills on pravastatin.  Home Medications    Current Outpatient Medications  Medication Sig Dispense Refill   Calcium Carbonate-Vitamin D 600-400 MG-UNIT tablet Take 1 tablet by mouth 2 (two) times daily.     Cholecalciferol (VITAMIN D PO) Take 1 tablet by mouth daily.      Cyanocobalamin (VITAMIN B-12 PO) Take 1 tablet by mouth daily.     dicyclomine (BENTYL) 10 MG capsule TAKE 1 CAPSULE BY MOUTH TWICE DAILY 60 capsule 6   Evolocumab (REPATHA SURECLICK) 283 MG/ML SOAJ Inject 140 mg into the skin every 14 (fourteen) days. 2 mL 11   Fiber POWD Take 2 Scoops by mouth daily. Acacia [place] Tummy fiber     pravastatin (PRAVACHOL) 20 MG tablet Take 1 tablet (20 mg total) by mouth every evening. Please contact the office to schedule appointment for additional refills. 2ed Attempt. 30 tablet 0   venlafaxine XR (EFFEXOR-XR) 75 MG 24 hr capsule Take 75 mg by mouth daily.     zaleplon (SONATA) 5 MG capsule Take 1 capsule  by mouth daily.     No current facility-administered medications for this visit.     Family History    Family History  Problem Relation Age of Onset   Heart disease Mother    Hypertension Mother    Breast cancer Mother    Colon cancer Neg Hx    Colon polyps Neg Hx    Esophageal cancer Neg Hx    Stomach cancer Neg Hx    Rectal cancer Neg Hx    Pancreatic cancer Neg Hx    She indicated that her mother is alive. She indicated that her father is alive. She indicated that the status of her neg hx is unknown.  Social History    Social History   Socioeconomic History   Marital status: Married    Spouse name: Not on file   Number of  children: Not on file   Years of education: Not on file   Highest education level: Not on file  Occupational History   Not on file  Tobacco Use   Smoking status: Never   Smokeless tobacco: Never  Vaping Use   Vaping Use: Never used  Substance and Sexual Activity   Alcohol use: Not Currently   Drug use: Never   Sexual activity: Not on file  Other Topics Concern   Not on file  Social History Narrative   Not on file   Social Determinants of Health   Financial Resource Strain: Not on file  Food Insecurity: Not on file  Transportation Needs: Not on file  Physical Activity: Not on file  Stress: Not on file  Social Connections: Not on file  Intimate Partner Violence: Not on file     Review of Systems    General:  No chills, fever, night sweats or weight changes.  Cardiovascular:  No chest pain, dyspnea on exertion, edema, orthopnea, palpitations, paroxysmal nocturnal dyspnea. Dermatological: No rash, lesions/masses Respiratory: No cough, dyspnea Urologic: No hematuria, dysuria Abdominal:   No nausea, vomiting, diarrhea, bright red blood per rectum, melena, or hematemesis Neurologic:  No visual changes, wkns, changes in mental status. All other systems reviewed and are otherwise negative except as noted above.     Physical Exam    VS:  There were no vitals taken for this visit. , BMI There is no height or weight on file to calculate BMI.     GEN: Well nourished, well developed, in no acute distress. HEENT: normal. Neck: Supple, no JVD, carotid bruits, or masses. Cardiac: RRR, no murmurs, rubs, or gallops. No clubbing, cyanosis, edema.  Radials/DP/PT 2+ and equal bilaterally.  Respiratory:  Respirations regular and unlabored, clear to auscultation bilaterally. GI: Soft, nontender, nondistended, BS + x 4. MS: no deformity or atrophy. Skin: warm and dry, no rash. Neuro:  Strength and sensation are intact. Psych: Normal affect.  Accessory Clinical Findings    ECG  personally reviewed by me today-normal sinus rhythm, left atrial enlargement,, RSR noted in V3 and V1.  Heart rate of 66 bpm- No acute changes  Lab Results  Component Value Date   WBC 4.1 09/14/2021   HGB 12.7 09/14/2021   HCT 37.4 09/14/2021   MCV 87.8 09/14/2021   PLT 311.0 09/14/2021   Lab Results  Component Value Date   CREATININE 0.71 09/14/2021   BUN 8 09/14/2021   NA 138 09/14/2021   K 4.3 09/14/2021   CL 100 09/14/2021   CO2 31 09/14/2021   Lab Results  Component Value Date   ALT 30  09/14/2021   AST 33 09/14/2021   ALKPHOS 61 09/14/2021   BILITOT 0.9 09/14/2021   Lab Results  Component Value Date   CHOL 113 09/06/2021   HDL 69 09/06/2021   LDLCALC 31 09/06/2021   TRIG 59 09/06/2021   CHOLHDL 1.6 09/06/2021    No results found for: "HGBA1C"  Review of Prior Studies: Coronary Calcium Score 09/14/2020  FINDINGS: Coronary arteries: Normal origins.   Coronary Calcium Score:   Left main: 0   Left anterior descending artery: 9.1   Left circumflex artery: 0   Right coronary artery: 25.6   Total: 34.7   Percentile: 84th   Pericardium: Normal.   Ascending Aorta: Normal caliber.   Non-cardiac: See separate report from [facility].   IMPRESSION: Coronary calcium score of 34.7. This was 84th percentile for age and sex matched controls.   Eleonore Chiquito, MD    Assessment & Plan   1.  Hyperlipidemia: Remains on Repatha and pravastatin.  She brings with her most recent labs completed by primary care on 05/15/2022.  Total cholesterol 138, triglycerides 67, HDL 85, LDL 39.  LFTs were normal.  There were no signs of kidney injury, no signs of anemia.  Refills are provided.  2.  Coronary artery calcifications: This was noted on coronary calcium score on 03/25/2021 with a score of 34.7, 84 percentile for age and sex matched controls.  Continue statin therapy, exercise, and heart healthy diet.  No plans for any further cardiac testing unless she  becomes symptomatic.    Current medicines are reviewed at length with the patient today.  I have spent 25 min's  dedicated to the care of this patient on the date of this encounter to include pre-visit review of records, assessment, management and diagnostic testing,with shared decision making. Signed, Phill Myron. West Pugh, ANP, [place]   06/15/2022 12:54 PM      Office [phone] Fax [phone]  Notice: This dictation was prepared with Dragon dictation along with smaller phrase technology. Any transcriptional errors that result from this process are unintentional and may not be corrected upon review.

## 2022-06-16 ENCOUNTER — Encounter: Payer: Self-pay | Admitting: Adult Health

## 2022-06-16 ENCOUNTER — Ambulatory Visit: Payer: BC Managed Care – PPO | Attending: Adult Health | Admitting: Adult Health

## 2022-06-16 VITALS — BP 104/66 | HR 66 | Ht 61.0 in | Wt 108.0 lb

## 2022-06-16 DIAGNOSIS — E7800 Pure hypercholesterolemia, unspecified: Secondary | ICD-10-CM

## 2022-06-16 DIAGNOSIS — I2510 Atherosclerotic heart disease of native coronary artery without angina pectoris: Secondary | ICD-10-CM | POA: Diagnosis not present

## 2022-06-16 MED ORDER — PRAVASTATIN SODIUM 20 MG PO TABS: 20.0000 mg | ORAL_TABLET | Freq: Every evening | ORAL | 3 refills | Status: DC

## 2022-06-16 NOTE — Patient Instructions (Signed)
Medication Instructions:  The current medical regimen is effective;  continue present plan and medications.  *If you need a refill on your cardiac medications before your next appointment, please call your pharmacy*  Follow-Up: At [facility], you and your health needs are our priority.  As part of our continuing mission to provide you with exceptional heart care, we have created designated Provider Care Teams.  These Care Teams include your primary Cardiologist (physician) and Advanced Practice Providers (APPs -  Physician Assistants and Nurse Practitioners) who all work together to provide you with the care you need, when you need it.  We recommend signing up for the patient portal called "MyChart".  Sign up information is provided on this After Visit Summary.  MyChart is used to connect with patients for Virtual Visits (Telemedicine).  Patients are able to view lab/test results, encounter notes, upcoming appointments, etc.  Non-urgent messages can be sent to your provider as well.   To learn more about what you can do with MyChart, go to [email or web address].    Your next appointment:   12 month(s)  The format for your next appointment:   In Person  Provider:   Berniece Salines, DO

## 2022-07-11 ENCOUNTER — Telehealth: Payer: Self-pay | Admitting: Pharmacist

## 2022-07-11 NOTE — Telephone Encounter (Signed)
Received request from pharmacy that PA is needed for [place]. This has been submitted, key [id number]

## 2022-07-14 MED ORDER — REPATHA SURECLICK 140 MG/ML ~~LOC~~ SOAJ: 140.0000 mg | SUBCUTANEOUS | 11 refills | Status: DC

## 2022-07-14 NOTE — Telephone Encounter (Signed)
PA approved through 07/10/23, refill sent in.

## 2022-08-21 IMAGING — CT CT CARDIAC CORONARY ARTERY CALCIUM SCORE
3 series · 14 of 20 positions shown, 15 images · non-contrast
Comparison: None.
COMPARISON: None.

Addendum:
EXAM:
OVER-READ INTERPRETATION  CT CHEST

The following report is an over-read performed by radiologist Dr.
Yumi Mastroianni [REDACTED] on 09/14/2020. This
over-read does not include interpretation of cardiac or coronary
anatomy or pathology. The coronary calcium score interpretation by
the cardiologist is attached.
CLINICAL DATA: Risk stratification
Coronary Calcium Score
TECHNIQUE: The patient was scanned on a Siemens Force scanner. Axial
non-contrast 3 mm slices were carried out through the heart. The
data set was analyzed on a dedicated work station and scored using
the Agatson method.

[Series 2: casc 3.0 bv41 2 bestdiast 70 % · axial · 0.34mm/px · z∈[-243,-162]mm · 4 of 45 slices shown, 5 images]
[im 9/45  vessel]
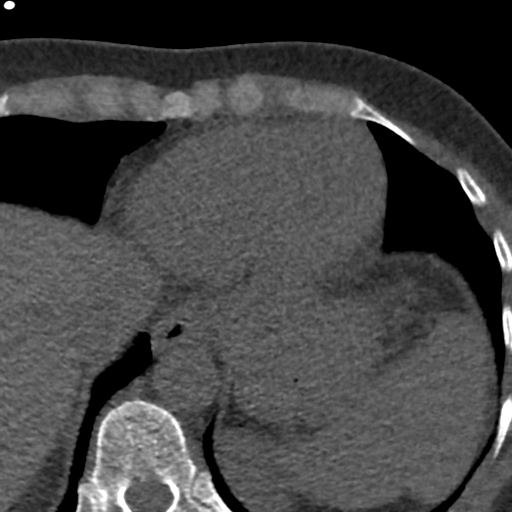
[im 9/45  lung]
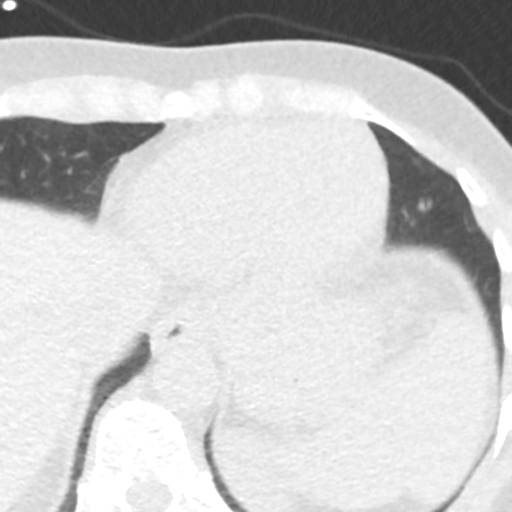
[im 18/45  vessel]
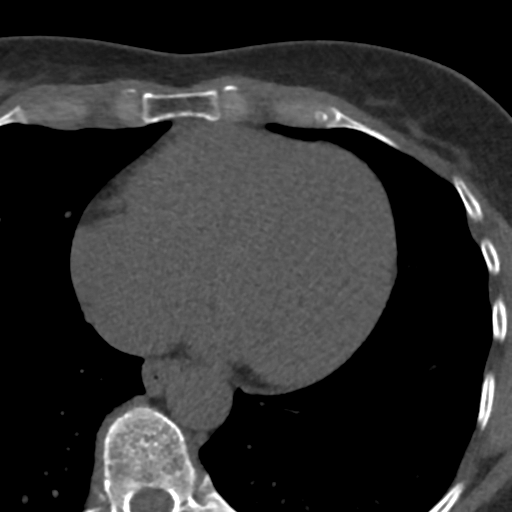
[im 27/45  vessel]
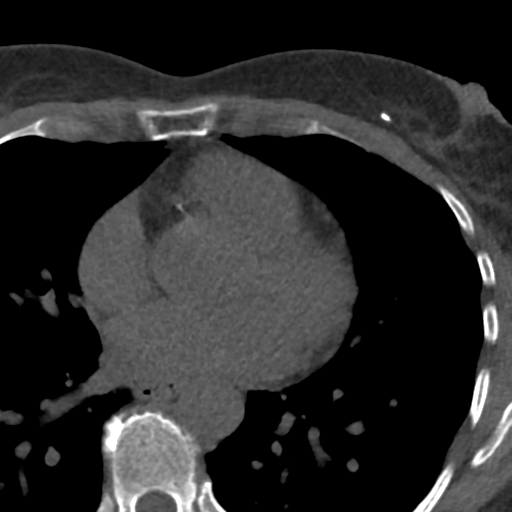
[im 36/45  vessel]
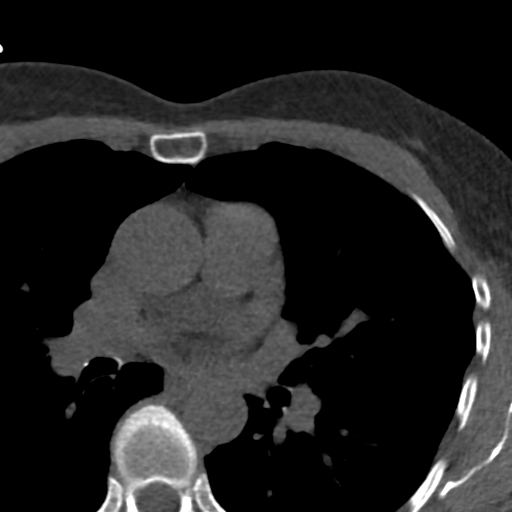

[Series 3: lung 65 % · axial · 0.62mm/px · z∈[-249,-159]mm · 5 of 46 slices shown]
[im 8/46  lung]
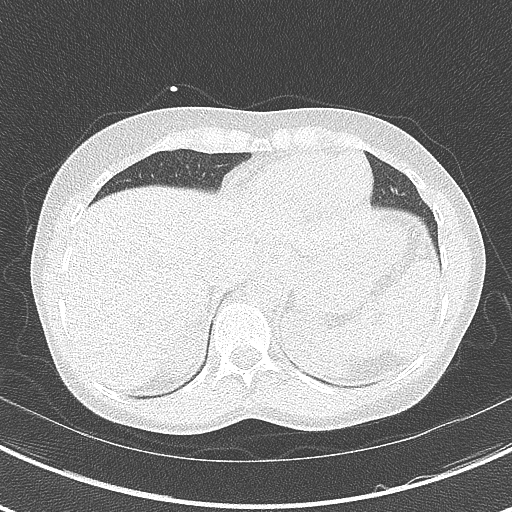
[im 16/46  lung]
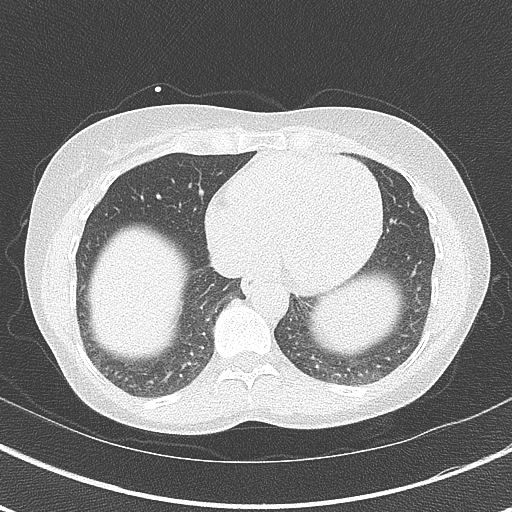
[im 23/46  lung]
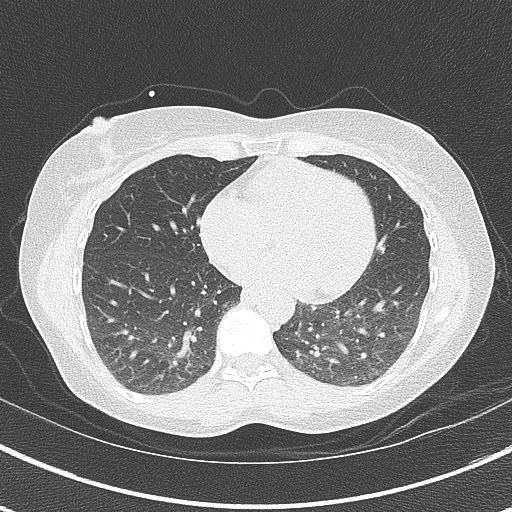
[im 31/46  lung]
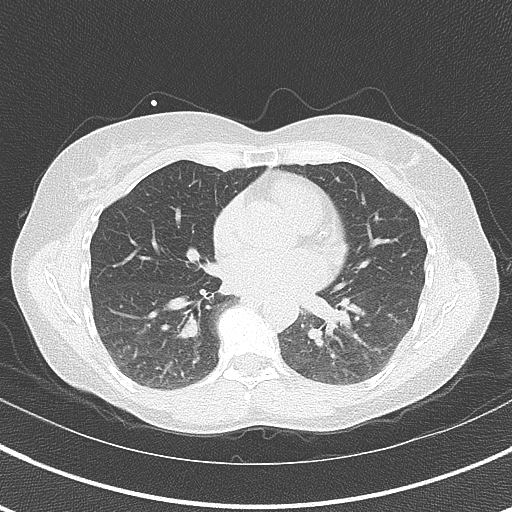
[im 38/46  lung]
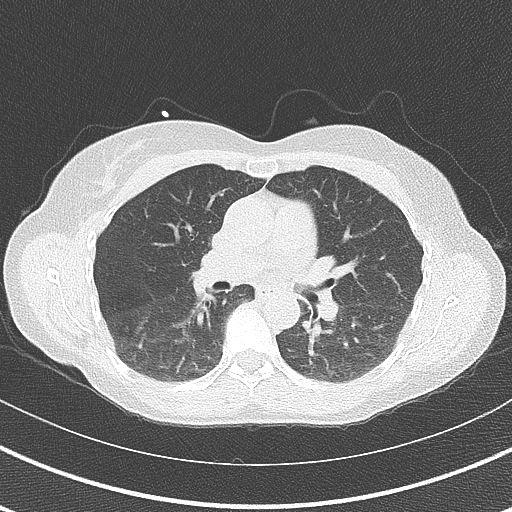

[Series 4: lung st 65 % · axial · 0.62mm/px · z∈[-249,-159]mm · 5 of 46 slices shown]
[im 8/46  lung]
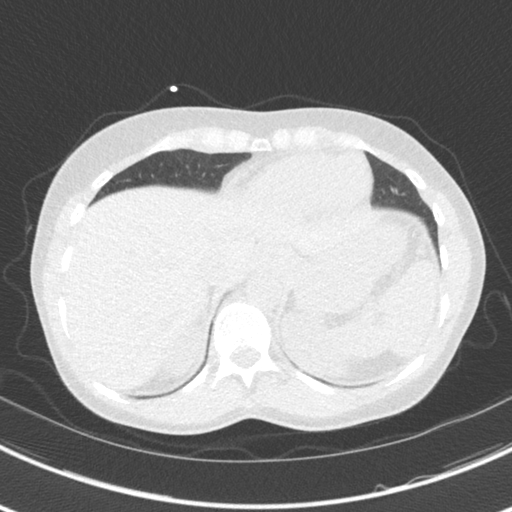
[im 16/46  lung]
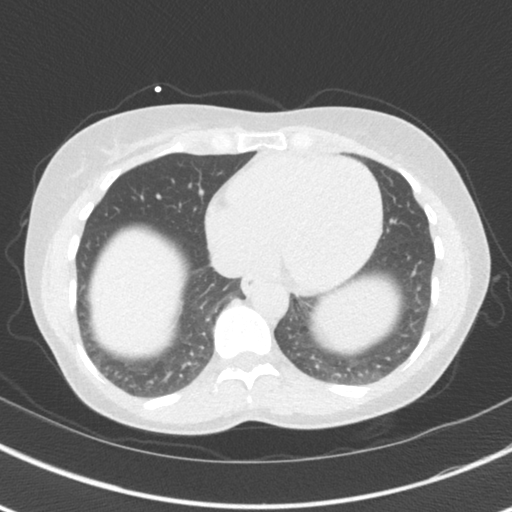
[im 23/46  lung]
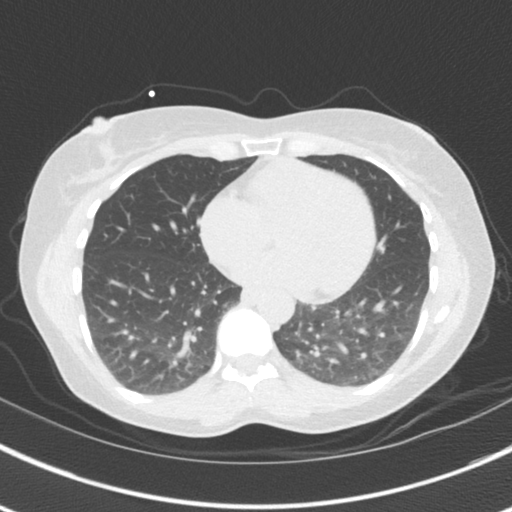
[im 31/46  lung]
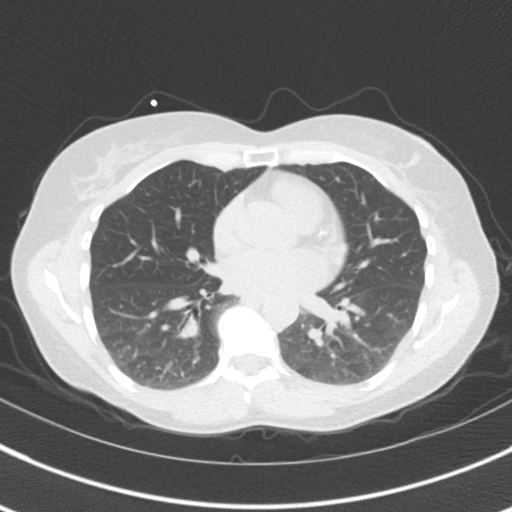
[im 38/46  lung]
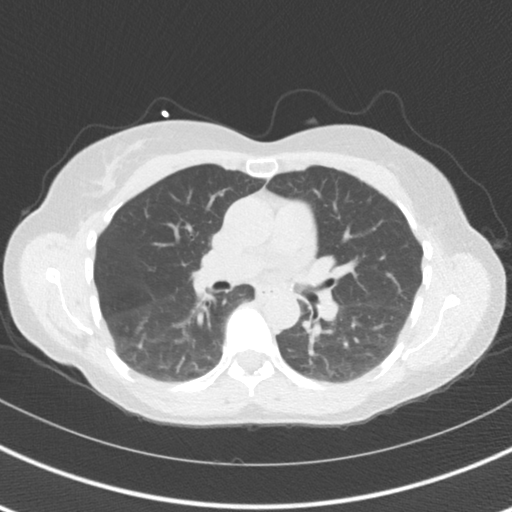

[14 of 20 positions shown; findings below may reference images not displayed]

FINDINGS: Vascular: No significant vascular findings. Normal heart size. No
pericardial effusion.

Mediastinum/Nodes: Visualized mediastinum and hilar regions
demonstrate no lymphadenopathy or masses.

Lungs/Pleura: Visualized lungs show no evidence of pulmonary edema,
consolidation, pneumothorax, nodule or pleural fluid.

Upper Abdomen: No acute abnormality.

Musculoskeletal: No chest wall mass or suspicious bone lesions
identified.
IMPRESSION: No significant incidental findings.
FINDINGS: Coronary arteries: Normal origins.

Coronary Calcium Score:

Left main: 0

Left anterior descending artery:

Left circumflex artery: 0

Right coronary artery:

Total:

Percentile: 84th

Pericardium: Normal.

Ascending Aorta: Normal caliber.

Non-cardiac: See separate report from [REDACTED].
IMPRESSION: Coronary calcium score of 34.7. This was 84th percentile for age and
sex matched controls.

*** End of Addendum ***
EXAM:
OVER-READ INTERPRETATION  CT CHEST

The following report is an over-read performed by radiologist Dr.
Yumi Mastroianni [REDACTED] on 09/14/2020. This
over-read does not include interpretation of cardiac or coronary
anatomy or pathology. The coronary calcium score interpretation by
the cardiologist is attached.
FINDINGS: Vascular: No significant vascular findings. Normal heart size. No
pericardial effusion.

Mediastinum/Nodes: Visualized mediastinum and hilar regions
demonstrate no lymphadenopathy or masses.

Lungs/Pleura: Visualized lungs show no evidence of pulmonary edema,
consolidation, pneumothorax, nodule or pleural fluid.

Upper Abdomen: No acute abnormality.

Musculoskeletal: No chest wall mass or suspicious bone lesions
identified.
IMPRESSION: No significant incidental findings.

## 2022-08-25 DIAGNOSIS — M8589 Other specified disorders of bone density and structure, multiple sites: Secondary | ICD-10-CM | POA: Diagnosis not present

## 2022-08-25 DIAGNOSIS — Z1231 Encounter for screening mammogram for malignant neoplasm of breast: Secondary | ICD-10-CM | POA: Diagnosis not present

## 2022-09-12 DIAGNOSIS — Z681 Body mass index (BMI) 19 or less, adult: Secondary | ICD-10-CM | POA: Diagnosis not present

## 2022-09-12 DIAGNOSIS — L600 Ingrowing nail: Secondary | ICD-10-CM | POA: Diagnosis not present

## 2022-10-21 DIAGNOSIS — S838X1A Sprain of other specified parts of right knee, initial encounter: Secondary | ICD-10-CM | POA: Diagnosis not present

## 2022-10-24 DIAGNOSIS — S838X1A Sprain of other specified parts of right knee, initial encounter: Secondary | ICD-10-CM | POA: Diagnosis not present

## 2022-11-02 DIAGNOSIS — M25561 Pain in right knee: Secondary | ICD-10-CM | POA: Diagnosis not present

## 2022-11-06 DIAGNOSIS — S83241A Other tear of medial meniscus, current injury, right knee, initial encounter: Secondary | ICD-10-CM | POA: Diagnosis not present

## 2022-11-13 DIAGNOSIS — F419 Anxiety disorder, unspecified: Secondary | ICD-10-CM | POA: Diagnosis not present

## 2022-11-13 DIAGNOSIS — G4700 Insomnia, unspecified: Secondary | ICD-10-CM | POA: Diagnosis not present

## 2022-11-13 DIAGNOSIS — E785 Hyperlipidemia, unspecified: Secondary | ICD-10-CM | POA: Diagnosis not present

## 2022-11-13 DIAGNOSIS — D509 Iron deficiency anemia, unspecified: Secondary | ICD-10-CM | POA: Diagnosis not present

## 2022-11-13 DIAGNOSIS — Z681 Body mass index (BMI) 19 or less, adult: Secondary | ICD-10-CM | POA: Diagnosis not present

## 2022-11-13 DIAGNOSIS — D649 Anemia, unspecified: Secondary | ICD-10-CM | POA: Diagnosis not present

## 2022-11-14 DIAGNOSIS — M948X6 Other specified disorders of cartilage, lower leg: Secondary | ICD-10-CM | POA: Diagnosis not present

## 2022-11-14 DIAGNOSIS — Y999 Unspecified external cause status: Secondary | ICD-10-CM | POA: Diagnosis not present

## 2022-11-14 DIAGNOSIS — X58XXXA Exposure to other specified factors, initial encounter: Secondary | ICD-10-CM | POA: Diagnosis not present

## 2022-11-14 DIAGNOSIS — S83241A Other tear of medial meniscus, current injury, right knee, initial encounter: Secondary | ICD-10-CM | POA: Diagnosis not present

## 2022-11-14 DIAGNOSIS — S83231A Complex tear of medial meniscus, current injury, right knee, initial encounter: Secondary | ICD-10-CM | POA: Diagnosis not present

## 2022-11-29 DIAGNOSIS — S83241D Other tear of medial meniscus, current injury, right knee, subsequent encounter: Secondary | ICD-10-CM | POA: Diagnosis not present

## 2022-12-20 DIAGNOSIS — S83241D Other tear of medial meniscus, current injury, right knee, subsequent encounter: Secondary | ICD-10-CM | POA: Diagnosis not present

## 2022-12-26 DIAGNOSIS — L720 Epidermal cyst: Secondary | ICD-10-CM | POA: Diagnosis not present

## 2022-12-26 DIAGNOSIS — L608 Other nail disorders: Secondary | ICD-10-CM | POA: Diagnosis not present

## 2022-12-26 DIAGNOSIS — L209 Atopic dermatitis, unspecified: Secondary | ICD-10-CM | POA: Diagnosis not present

## 2022-12-26 DIAGNOSIS — L821 Other seborrheic keratosis: Secondary | ICD-10-CM | POA: Diagnosis not present

## 2022-12-26 DIAGNOSIS — B351 Tinea unguium: Secondary | ICD-10-CM | POA: Diagnosis not present

## 2022-12-26 DIAGNOSIS — C44519 Basal cell carcinoma of skin of other part of trunk: Secondary | ICD-10-CM | POA: Diagnosis not present

## 2022-12-26 DIAGNOSIS — L578 Other skin changes due to chronic exposure to nonionizing radiation: Secondary | ICD-10-CM | POA: Diagnosis not present

## 2022-12-29 DIAGNOSIS — M25561 Pain in right knee: Secondary | ICD-10-CM | POA: Diagnosis not present

## 2023-01-04 DIAGNOSIS — T63481A Toxic effect of venom of other arthropod, accidental (unintentional), initial encounter: Secondary | ICD-10-CM | POA: Diagnosis not present

## 2023-01-04 DIAGNOSIS — A6920 Lyme disease, unspecified: Secondary | ICD-10-CM | POA: Diagnosis not present

## 2023-01-08 DIAGNOSIS — R9230 Dense breasts, unspecified: Secondary | ICD-10-CM | POA: Diagnosis not present

## 2023-01-08 DIAGNOSIS — Z853 Personal history of malignant neoplasm of breast: Secondary | ICD-10-CM | POA: Diagnosis not present

## 2023-01-09 DIAGNOSIS — M25561 Pain in right knee: Secondary | ICD-10-CM | POA: Diagnosis not present

## 2023-01-16 DIAGNOSIS — S83411A Sprain of medial collateral ligament of right knee, initial encounter: Secondary | ICD-10-CM | POA: Diagnosis not present

## 2023-01-16 DIAGNOSIS — M25461 Effusion, right knee: Secondary | ICD-10-CM | POA: Diagnosis not present

## 2023-01-16 DIAGNOSIS — S83241A Other tear of medial meniscus, current injury, right knee, initial encounter: Secondary | ICD-10-CM | POA: Diagnosis not present

## 2023-01-16 DIAGNOSIS — M25561 Pain in right knee: Secondary | ICD-10-CM | POA: Diagnosis not present

## 2023-01-16 DIAGNOSIS — M1711 Unilateral primary osteoarthritis, right knee: Secondary | ICD-10-CM | POA: Diagnosis not present

## 2023-01-19 DIAGNOSIS — M25561 Pain in right knee: Secondary | ICD-10-CM | POA: Diagnosis not present

## 2023-01-19 DIAGNOSIS — M25461 Effusion, right knee: Secondary | ICD-10-CM | POA: Diagnosis not present

## 2023-01-19 DIAGNOSIS — S83241D Other tear of medial meniscus, current injury, right knee, subsequent encounter: Secondary | ICD-10-CM | POA: Diagnosis not present

## 2023-01-26 DIAGNOSIS — M25561 Pain in right knee: Secondary | ICD-10-CM | POA: Diagnosis not present

## 2023-02-02 DIAGNOSIS — M25561 Pain in right knee: Secondary | ICD-10-CM | POA: Diagnosis not present

## 2023-02-09 DIAGNOSIS — M25561 Pain in right knee: Secondary | ICD-10-CM | POA: Diagnosis not present

## 2023-02-14 DIAGNOSIS — M79604 Pain in right leg: Secondary | ICD-10-CM | POA: Diagnosis not present

## 2023-02-14 DIAGNOSIS — M25569 Pain in unspecified knee: Secondary | ICD-10-CM | POA: Diagnosis not present

## 2023-02-14 DIAGNOSIS — M4317 Spondylolisthesis, lumbosacral region: Secondary | ICD-10-CM | POA: Diagnosis not present

## 2023-02-14 DIAGNOSIS — M160 Bilateral primary osteoarthritis of hip: Secondary | ICD-10-CM | POA: Diagnosis not present

## 2023-02-14 DIAGNOSIS — M25561 Pain in right knee: Secondary | ICD-10-CM | POA: Diagnosis not present

## 2023-02-16 DIAGNOSIS — S83241D Other tear of medial meniscus, current injury, right knee, subsequent encounter: Secondary | ICD-10-CM | POA: Diagnosis not present

## 2023-02-19 DIAGNOSIS — M1711 Unilateral primary osteoarthritis, right knee: Secondary | ICD-10-CM | POA: Diagnosis not present

## 2023-02-23 DIAGNOSIS — M25561 Pain in right knee: Secondary | ICD-10-CM | POA: Diagnosis not present

## 2023-05-16 DIAGNOSIS — E559 Vitamin D deficiency, unspecified: Secondary | ICD-10-CM | POA: Diagnosis not present

## 2023-05-16 DIAGNOSIS — G4700 Insomnia, unspecified: Secondary | ICD-10-CM | POA: Diagnosis not present

## 2023-05-16 DIAGNOSIS — E785 Hyperlipidemia, unspecified: Secondary | ICD-10-CM | POA: Diagnosis not present

## 2023-05-16 DIAGNOSIS — Z1231 Encounter for screening mammogram for malignant neoplasm of breast: Secondary | ICD-10-CM | POA: Diagnosis not present

## 2023-05-16 DIAGNOSIS — F419 Anxiety disorder, unspecified: Secondary | ICD-10-CM | POA: Diagnosis not present

## 2023-06-04 ENCOUNTER — Other Ambulatory Visit: Payer: Self-pay | Admitting: Pharmacist

## 2023-06-04 MED ORDER — REPATHA SURECLICK 140 MG/ML ~~LOC~~ SOAJ: 140.0000 mg | SUBCUTANEOUS | 11 refills | Status: DC

## 2023-06-18 ENCOUNTER — Ambulatory Visit: Payer: BC Managed Care – PPO | Attending: Cardiology | Admitting: Cardiology

## 2023-06-18 VITALS — BP 124/70 | HR 68 | Ht 61.0 in | Wt 108.6 lb

## 2023-06-18 DIAGNOSIS — R002 Palpitations: Secondary | ICD-10-CM | POA: Diagnosis not present

## 2023-06-18 MED ORDER — REPATHA SURECLICK 140 MG/ML ~~LOC~~ SOAJ: 140.0000 mg | SUBCUTANEOUS | 11 refills | Status: DC

## 2023-06-18 MED ORDER — PRAVASTATIN SODIUM 20 MG PO TABS: 20.0000 mg | ORAL_TABLET | Freq: Every evening | ORAL | 3 refills | Status: DC

## 2023-06-18 NOTE — Progress Notes (Signed)
Cardiology Office Note:    Date:  06/18/2023   ID:  Katelyn Cruz, DOB [birth year], MRN [id number]  PCP:  Jim Like, NP  Cardiologist:  Thomasene Ripple, DO  Electrophysiologist:  None   Referring MD: Jim Like, NP      History of Present Illness:    Katelyn Cruz is a 62 y.o. female with a hx of coronary artery calcification, vitamin D deficiency, familial hyperlipidemia, insomnia, history of breast cancer here today for follow-up visit.    Since I last saw the patient she has follow-up in the office and was seen in December 2023 at that time she saw Bailey Mech, DNP.  During that visit did discuss her lipid profile.  And was advised to remain on her Repatha and pravastatin.  She was asymptomatic there is no plans for any further cardiac testing.  Since her last visit she has been doing well.  She is taking her Repatha as prescribed as well as her pravastatin.   Past Medical History:  Diagnosis Date   Allergic rhinitis    Anemia    Anxiety    Arthralgia of multiple sites    Calculus of gallbladder with chronic cholecystitis without obstruction 08/27/2018   Cancer (HCC)    Left Breast cancer, 2010   Elevated LFTs 08/27/2018   Elevated liver enzymes    Epigastric pain 08/27/2018   External hemorrhoids    Hyperlipidemia    Insomnia    Low vitamin D level    Malignant neoplasm of upper-outer quadrant of left breast in female, estrogen receptor positive (HCC) 08/01/2016   Osteoporosis    Palpitations    Postoperative examination 11/12/2018   Tibialis posterior tendinitis, right 09/27/2016    Past Surgical History:  Procedure Laterality Date   APPENDECTOMY     CHOLECYSTECTOMY  11/2018   [facility]. Dr Georgiana Shore   COLONOSCOPY  03/01/2015   Colonic polyps status post polypectomy. Internal and external hemorrhoids   COLONOSCOPY WITH PROPOFOL  09/14/2021   MASTECTOMY Left    Subtotal   PARTIAL MASTECTOMY WITH AXILLARY SENTINEL LYMPH NODE BIOPSY     TUBAL  LIGATION     UMBILICAL HERNIA REPAIR     VAGINAL HYSTERECTOMY  10/24/2011    Current Medications: Current Meds  Medication Sig   Calcium Carbonate-Vitamin D 600-400 MG-UNIT tablet Take 1 tablet by mouth 2 (two) times daily.   Cholecalciferol (VITAMIN D PO) Take 1 tablet by mouth daily.    Cyanocobalamin (VITAMIN B-12 PO) Take 1 tablet by mouth daily.   Evolocumab (REPATHA SURECLICK) 140 MG/ML SOAJ Inject 140 mg into the skin every 14 (fourteen) days.   Fiber POWD Take 2 Scoops by mouth daily. Acacia [place] Tummy fiber   pravastatin (PRAVACHOL) 20 MG tablet Take 1 tablet (20 mg total) by mouth every evening.   venlafaxine XR (EFFEXOR-XR) 75 MG 24 hr capsule Take 75 mg by mouth daily.   zaleplon (SONATA) 5 MG capsule Take 1 capsule by mouth daily.     Allergies:   Patient has no known allergies.   Social History   Socioeconomic History   Marital status: Married    Spouse name: Not on file   Number of children: Not on file   Years of education: Not on file   Highest education level: Not on file  Occupational History   Not on file  Tobacco Use   Smoking status: Never   Smokeless tobacco: Never  Vaping Use   Vaping status: Never Used  Substance and Sexual Activity   Alcohol use: Not Currently   Drug use: Never   Sexual activity: Not on file  Other Topics Concern   Not on file  Social History Narrative   Not on file   Social Drivers of Health   Financial Resource Strain: Not on file  Food Insecurity: Not on file  Transportation Needs: Not on file  Physical Activity: Not on file  Stress: Not on file  Social Connections: Not on file     Family History: The patient's family history includes Breast cancer in her mother; Heart disease in her mother; Hypertension in her mother. There is no history of Colon cancer, Colon polyps, Esophageal cancer, Stomach cancer, Rectal cancer, or Pancreatic cancer.  ROS:   Review of Systems  Constitution: Negative for decreased  appetite, fever and weight gain.  HENT: Negative for congestion, ear discharge, hoarse voice and sore throat.   Eyes: Negative for discharge, redness, vision loss in right eye and visual halos.  Cardiovascular: Negative for chest pain, dyspnea on exertion, leg swelling, orthopnea and palpitations.  Respiratory: Negative for cough, hemoptysis, shortness of breath and snoring.   Endocrine: Negative for heat intolerance and polyphagia.  Hematologic/Lymphatic: Negative for bleeding problem. Does not bruise/bleed easily.  Skin: Negative for flushing, nail changes, rash and suspicious lesions.  Musculoskeletal: Negative for arthritis, joint pain, muscle cramps, myalgias, neck pain and stiffness.  Gastrointestinal: Negative for abdominal pain, bowel incontinence, diarrhea and excessive appetite.  Genitourinary: Negative for decreased libido, genital sores and incomplete emptying.  Neurological: Negative for brief paralysis, focal weakness, headaches and loss of balance.  Psychiatric/Behavioral: Negative for altered mental status, depression and suicidal ideas.  Allergic/Immunologic: Negative for HIV exposure and persistent infections.    EKGs/Labs/Other Studies Reviewed:    The following studies were reviewed today:   EKG:  The ekg ordered today demonstrates   Recent Labs: No results found for requested labs within last 365 days.  Recent Lipid Panel    Component Value Date/Time   CHOL 113 09/06/2021 0805   TRIG 59 09/06/2021 0805   HDL 69 09/06/2021 0805   CHOLHDL 1.6 09/06/2021 0805   LDLCALC 31 09/06/2021 0805    Physical Exam:    VS:  Ht 5\' 1"  (1.549 m)   Wt 108 lb 9.6 oz (49.3 kg)   BMI 20.52 kg/m     Wt Readings from Last 3 Encounters:  06/18/23 108 lb 9.6 oz (49.3 kg)  06/16/22 108 lb (49 kg)  09/14/21 103 lb (46.7 kg)     GEN: Well nourished, well developed in no acute distress HEENT: Normal NECK: No JVD; No carotid bruits LYMPHATICS: No lymphadenopathy CARDIAC:  S1S2 noted,RRR, no murmurs, rubs, gallops RESPIRATORY:  Clear to auscultation without rales, wheezing or rhonchi  ABDOMEN: Soft, non-tender, non-distended, +bowel sounds, no guarding. EXTREMITIES: No edema, No cyanosis, no clubbing MUSCULOSKELETAL:  No deformity  SKIN: Warm and dry NEUROLOGIC:  Alert and oriented x 3, non-focal PSYCHIATRIC:  Normal affect, good insight  ASSESSMENT:    1. Palpitations    PLAN:    She is doing well from a clinical standpoint.  Lab work was done by her PCP in November 2024 lipid profile: Total cholesterol 128, HDL 83, LDL 31, triglycerides 65.  Will send refills on her Repatha as well as her The patient is in agreement with the above plan. The patient left the office in stable condition.  The patient will follow up in   Medication Adjustments/Labs and Tests Ordered:  Current medicines are reviewed at length with the patient today.  Concerns regarding medicines are outlined above.  Orders Placed This Encounter  Procedures   EKG 12-Lead   No orders of the defined types were placed in this encounter.   There are no Patient Instructions on file for this visit.   Adopting a Healthy Lifestyle.  Know what a healthy weight is for you (roughly BMI <25) and aim to maintain this   Aim for 7+ servings of fruits and vegetables daily   65-80+ fluid ounces of water or unsweet tea for healthy kidneys   Limit to max 1 drink of alcohol per day; avoid smoking/tobacco   Limit animal fats in diet for cholesterol and heart health - choose grass fed whenever available   Avoid highly processed foods, and foods high in saturated/trans fats   Aim for low stress - take time to unwind and care for your mental health   Aim for 150 min of moderate intensity exercise weekly for heart health, and weights twice weekly for bone health   Aim for 7-9 hours of sleep daily   When it comes to diets, agreement about the perfect plan isnt easy to find, even among the  experts. Experts at the [facility] [employer] developed an idea known as the Healthy Eating Plate. Just imagine a plate divided into logical, healthy portions.   The emphasis is on diet quality:   Load up on vegetables and fruits - one-half of your plate: Aim for color and variety, and remember that potatoes dont count.   Go for whole grains - one-quarter of your plate: Whole wheat, barley, wheat berries, quinoa, oats, brown rice, and foods made with them. If you want pasta, go with whole wheat pasta.   Protein power - one-quarter of your plate: Fish, chicken, beans, and nuts are all healthy, versatile protein sources. Limit red meat.   The diet, however, does go beyond the plate, offering a few other suggestions.   Use healthy plant oils, such as olive, canola, soy, corn, sunflower and peanut. Check the labels, and avoid partially hydrogenated oil, which have unhealthy trans fats.   If youre thirsty, drink water. Coffee and tea are good in moderation, but skip sugary drinks and limit milk and dairy products to one or two daily servings.   The type of carbohydrate in the diet is more important than the amount. Some sources of carbohydrates, such as vegetables, fruits, whole grains, and beans-are healthier than others.   Finally, stay active  Signed, Thomasene Ripple, DO  06/18/2023 8:11 AM    [facility]

## 2023-06-18 NOTE — Patient Instructions (Signed)
Medication Instructions:  Your physician recommends that you continue on your current medications as directed. Please refer to the Current Medication list given to you today.  *If you need a refill on your cardiac medications before your next appointment, please call your pharmacy*     Follow-Up: At [facility], you and your health needs are our priority.  As part of our continuing mission to provide you with exceptional heart care, we have created designated Provider Care Teams.  These Care Teams include your primary Cardiologist (physician) and Advanced Practice Providers (APPs -  Physician Assistants and Nurse Practitioners) who all work together to provide you with the care you need, when you need it.  We recommend signing up for the patient portal called "MyChart".  Sign up information is provided on this After Visit Summary.  MyChart is used to connect with patients for Virtual Visits (Telemedicine).  Patients are able to view lab/test results, encounter notes, upcoming appointments, etc.  Non-urgent messages can be sent to your provider as well.   To learn more about what you can do with MyChart, go to [email or web address].    Your next appointment:   12 month(s)  Provider:   Thomasene Ripple, DO

## 2023-06-19 ENCOUNTER — Encounter: Payer: Self-pay | Admitting: Cardiology

## 2023-07-07 DIAGNOSIS — J019 Acute sinusitis, unspecified: Secondary | ICD-10-CM | POA: Diagnosis not present

## 2023-07-23 ENCOUNTER — Telehealth: Payer: Self-pay | Admitting: Cardiology

## 2023-07-23 ENCOUNTER — Telehealth: Payer: Self-pay | Admitting: Pharmacy Technician

## 2023-07-23 ENCOUNTER — Other Ambulatory Visit (HOSPITAL_COMMUNITY): Payer: Self-pay

## 2023-07-23 NOTE — Telephone Encounter (Signed)
Pharmacy Patient Advocate Encounter   Received notification from Pt Calls Messages that prior authorization for repatha is required/requested.   Insurance verification completed.   The patient is insured through [facility] .   Per test claim: PA required; PA submitted to above mentioned insurance via CoverMyMeds Key/confirmation #/EOC [id number] Status is pending

## 2023-07-23 NOTE — Telephone Encounter (Signed)
Pharmacy Patient Advocate Encounter  Received notification from [facility] that Prior Authorization for repatha has been APPROVED from 07/23/23 to 07/22/24   PA #/Case ID/Reference #: [id number]

## 2023-07-23 NOTE — Telephone Encounter (Signed)
Patient made aware.

## 2023-07-23 NOTE — Telephone Encounter (Signed)
Pt c/o medication issue:  1. Name of Medication: Evolocumab (REPATHA SURECLICK) 140 MG/ML SOAJ   2. How are you currently taking this medication (dosage and times per day)?    3. Are you having a reaction (difficulty breathing--STAT)? no  4. What is your medication issue? Patient states that a prior Berkley Harvey is needed with new insurance. Please advise

## 2023-08-07 ENCOUNTER — Telehealth: Payer: Self-pay | Admitting: Pharmacy Technician

## 2023-08-07 ENCOUNTER — Other Ambulatory Visit (HOSPITAL_COMMUNITY): Payer: Self-pay

## 2023-08-07 NOTE — Telephone Encounter (Signed)
 Pharmacy Patient Advocate Encounter  Received notification from [facility] that Prior Authorization for repatha  has been APPROVED from 08/07/23 to 08/05/24. Ran test claim, Copay is $30.00 one month. This test claim was processed through [facility]- copay amounts may vary at other pharmacies due to pharmacy/plan contracts, or as the patient moves through the different stages of their insurance plan.   PA #/Case ID/Reference #: [id number]

## 2023-08-07 NOTE — Telephone Encounter (Signed)
Pt calling back stating that she has new Surveyor, mining and PA needs to be done for repatha, pharmacy benefits are [facility]

## 2023-08-07 NOTE — Telephone Encounter (Signed)
Pharmacy Patient Advocate Encounter   Received notification from Pt Calls Messages that prior authorization for repatha is required/requested.   Insurance verification completed.   The patient is insured through [facility] .   Per test claim: PA required; PA submitted to above mentioned insurance via CoverMyMeds Key/confirmation #/EOC [id number] Status is pending

## 2023-10-23 ENCOUNTER — Other Ambulatory Visit (HOSPITAL_BASED_OUTPATIENT_CLINIC_OR_DEPARTMENT_OTHER): Payer: Self-pay

## 2023-10-23 ENCOUNTER — Ambulatory Visit (HOSPITAL_BASED_OUTPATIENT_CLINIC_OR_DEPARTMENT_OTHER)
Admission: EM | Admit: 2023-10-23 | Discharge: 2023-10-23 | Disposition: A | Discharge status: Home / Self Care | Attending: Family Medicine | Admitting: Family Medicine

## 2023-10-23 ENCOUNTER — Encounter (HOSPITAL_BASED_OUTPATIENT_CLINIC_OR_DEPARTMENT_OTHER): Payer: Self-pay

## 2023-10-23 DIAGNOSIS — R051 Acute cough: Secondary | ICD-10-CM

## 2023-10-23 MED ORDER — BENZONATATE 100 MG PO CAPS
100.0000 mg | ORAL_CAPSULE | Freq: Three times a day (TID) | ORAL | 0 refills | Status: DC
  Filled 2023-10-23: qty 21, 7d supply, fill #0

## 2023-10-23 NOTE — ED Triage Notes (Signed)
 Onset Thursday of nasal congestion/drainage which now has become a cough. Taking sudafed and tylenol

## 2023-10-23 NOTE — Discharge Instructions (Signed)
 Recommend taking antihistamine in addition to Flonase nasal spray for nasal congestion and drainage. You can do Mucinex for cough and chest congestion. I am prescribing Tessalon  Perles to help with the cough to hopefully help you sleep. Follow-up for any continued or worsening issues

## 2023-10-23 NOTE — ED Provider Notes (Signed)
 [facility]    CSN: [id number] Arrival date & time: 10/23/23  1032      History   Chief Complaint Chief Complaint  Patient presents with   Cough   Sinus congestion    HPI Katelyn Cruz is a 63 y.o. female.   Patient is a 63 year old female who presents today with sinus congestion, cough.  The sinus congestion started approximately 5 days ago.  She has been doing over-the-counter medications without much relief.  She is here today for concerns of cough and out of sleep.  The mucus is clear.  She is not have any chest pain or shortness of breath.  No fevers or chills.   Cough   Past Medical History:  Diagnosis Date   Allergic rhinitis    Anemia    Anxiety    Arthralgia of multiple sites    Calculus of gallbladder with chronic cholecystitis without obstruction 08/27/2018   Cancer (HCC)    Left Breast cancer, 2010   Elevated LFTs 08/27/2018   Elevated liver enzymes    Epigastric pain 08/27/2018   External hemorrhoids    Hyperlipidemia    Insomnia    Low vitamin D level    Malignant neoplasm of upper-outer quadrant of left breast in female, estrogen receptor positive (HCC) 08/01/2016   Osteoporosis    Palpitations    Postoperative examination 11/12/2018   Tibialis posterior tendinitis, right 09/27/2016    Patient Active Problem List   Diagnosis Date Noted   Coronary artery calcification seen on CT scan 03/25/2021   Palpitations    Osteoporosis    Low vitamin D level    Insomnia    External hemorrhoids    Hyperlipidemia    Elevated liver enzymes    Arthralgia of multiple sites    Anxiety    Allergic rhinitis    Postoperative examination 11/12/2018   Calculus of gallbladder with chronic cholecystitis without obstruction 08/27/2018   Elevated LFTs 08/27/2018   Epigastric pain 08/27/2018   Tibialis posterior tendinitis, right 09/27/2016   Cancer (HCC) 08/31/2016   Malignant neoplasm of upper-outer quadrant of left breast in female, estrogen receptor  positive (HCC) 08/01/2016    Past Surgical History:  Procedure Laterality Date   APPENDECTOMY     CHOLECYSTECTOMY  11/2018   [facility]. Dr Jonita Neth   COLONOSCOPY  03/01/2015   Colonic polyps status post polypectomy. Internal and external hemorrhoids   COLONOSCOPY WITH PROPOFOL  09/14/2021   MASTECTOMY Left    Subtotal   PARTIAL MASTECTOMY WITH AXILLARY SENTINEL LYMPH NODE BIOPSY     TUBAL LIGATION     UMBILICAL HERNIA REPAIR     VAGINAL HYSTERECTOMY  10/24/2011    OB History   No obstetric history on file.      Home Medications    Prior to Admission medications   Medication Sig Start Date End Date Taking? Authorizing Provider  benzonatate  (TESSALON ) 100 MG capsule Take 1 capsule (100 mg total) by mouth every 8 (eight) hours. 10/23/23  Yes Dominyk Law A, FNP  Calcium  Carbonate-Vitamin D 600-400 MG-UNIT tablet Take 1 tablet by mouth 2 (two) times daily.    [provider]  Cholecalciferol (VITAMIN D PO) Take 1 tablet by mouth daily.     [provider]  Cyanocobalamin  (VITAMIN B-12 PO) Take 1 tablet by mouth daily.    [provider]  dicyclomine  (BENTYL ) 10 MG capsule TAKE 1 CAPSULE BY MOUTH TWICE DAILY 06/17/21   Lajuan Pila, MD  Evolocumab  (REPATHA   SURECLICK) 140 MG/ML SOAJ Inject 140 mg into the skin every 14 (fourteen) days. 06/18/23   Tobb, Kardie, DO  Fiber POWD Take 2 Scoops by mouth daily. Acacia [place] Tummy fiber    [provider]  pravastatin  (PRAVACHOL ) 20 MG tablet Take 1 tablet (20 mg total) by mouth every evening. 06/18/23   Tobb, Kardie, DO  venlafaxine XR (EFFEXOR-XR) 75 MG 24 hr capsule Take 75 mg by mouth daily. 08/23/20   [provider]  zaleplon (SONATA) 5 MG capsule Take 1 capsule by mouth daily. 01/14/21   [provider]    Family History Family History  Problem Relation Age of Onset   Heart disease Mother    Hypertension Mother    Breast cancer Mother    Colon cancer Neg Hx    Colon  polyps Neg Hx    Esophageal cancer Neg Hx    Stomach cancer Neg Hx    Rectal cancer Neg Hx    Pancreatic cancer Neg Hx     Social History Social History   Tobacco Use   Smoking status: Never   Smokeless tobacco: Never  Vaping Use   Vaping status: Never Used  Substance Use Topics   Alcohol use: Not Currently   Drug use: Never     Allergies   Patient has no known allergies.   Review of Systems Review of Systems  Respiratory:  Positive for cough.      Physical Exam Triage Vital Signs ED Triage Vitals  Encounter Vitals Group     BP 10/23/23 1114 114/74     Systolic BP Percentile --      Diastolic BP Percentile --      Pulse Rate 10/23/23 1114 77     Resp 10/23/23 1114 20     Temp 10/23/23 1114 98.6 F (37 C)     Temp Source 10/23/23 1114 Oral     SpO2 10/23/23 1114 97 %     Weight --      Height --      Head Circumference --      Peak Flow --      Pain Score 10/23/23 1116 0     Pain Loc --      Pain Education --      Exclude from Growth Chart --    No data found.  Updated Vital Signs BP 114/74 (BP Location: Right Arm)   Pulse 77   Temp 98.6 F (37 C) (Oral)   Resp 20   SpO2 97%   Visual Acuity Right Eye Distance:   Left Eye Distance:   Bilateral Distance:    Right Eye Near:   Left Eye Near:    Bilateral Near:     Physical Exam Constitutional:      General: She is not in acute distress.    Appearance: Normal appearance. She is not ill-appearing, toxic-appearing or diaphoretic.  HENT:     Head: Normocephalic and atraumatic.     Right Ear: Tympanic membrane and ear canal normal.     Left Ear: Tympanic membrane and ear canal normal.     Nose: Congestion and rhinorrhea present.     Mouth/Throat:     Pharynx: Oropharynx is clear.  Eyes:     Conjunctiva/sclera: Conjunctivae normal.  Cardiovascular:     Rate and Rhythm: Normal rate and regular rhythm.     Pulses: Normal pulses.     Heart sounds: Normal heart sounds.  Pulmonary:     Effort:  Pulmonary effort is normal.     Breath sounds: Normal breath sounds.  Skin:    General: Skin is warm and dry.  Neurological:     Mental Status: She is alert.  Psychiatric:        Mood and Affect: Mood normal.      UC Treatments / Results  Labs (all labs ordered are listed, but only abnormal results are displayed) Labs Reviewed - No data to display  EKG   Radiology No results found.  Procedures Procedures (including critical care time)  Medications Ordered in UC Medications - No data to display  Initial Impression / Assessment and Plan / UC Course  I have reviewed the triage vital signs and the nursing notes.  Pertinent labs & imaging results that were available during my care of the patient were reviewed by me and considered in my medical decision making (see chart for details).     Acute cough-most likely viral or allergy related.  Recommended taking antihistamine to include Zyrtec and Flonase nasal spray for nasal congestion. Mucinex for cough and congestion  Tessalon  Pearles for cough as needed and to help sleep  Final Clinical Impressions(s) / UC Diagnoses   Final diagnoses:  Acute cough     Discharge Instructions      Recommend taking antihistamine in addition to Flonase nasal spray for nasal congestion and drainage. You can do Mucinex for cough and chest congestion. I am prescribing Tessalon  Perles to help with the cough to hopefully help you sleep. Follow-up for any continued or worsening issues    ED Prescriptions     Medication Sig Dispense Auth. Provider   benzonatate  (TESSALON ) 100 MG capsule Take 1 capsule (100 mg total) by mouth every 8 (eight) hours. 21 capsule Landa Pine, FNP      PDMP not reviewed this encounter.   Landa Pine, FNP 10/23/23 [phone]

## 2023-10-25 ENCOUNTER — Ambulatory Visit (HOSPITAL_BASED_OUTPATIENT_CLINIC_OR_DEPARTMENT_OTHER): Payer: Self-pay

## 2024-03-08 ENCOUNTER — Ambulatory Visit (INDEPENDENT_AMBULATORY_CARE_PROVIDER_SITE_OTHER): Admit: 2024-03-08 | Discharge: 2024-03-08 | Disposition: A | Discharge status: Home / Self Care | Admitting: Radiology

## 2024-03-08 ENCOUNTER — Ambulatory Visit (HOSPITAL_BASED_OUTPATIENT_CLINIC_OR_DEPARTMENT_OTHER)
Admission: RE | Admit: 2024-03-08 | Discharge: 2024-03-08 | Disposition: A | Discharge status: Home / Self Care | Source: Ambulatory Visit | Attending: Family Medicine | Admitting: Family Medicine

## 2024-03-08 ENCOUNTER — Encounter (HOSPITAL_BASED_OUTPATIENT_CLINIC_OR_DEPARTMENT_OTHER): Payer: Self-pay

## 2024-03-08 VITALS — BP 118/72 | HR 67 | Temp 98.0°F | Resp 18

## 2024-03-08 DIAGNOSIS — M25552 Pain in left hip: Secondary | ICD-10-CM

## 2024-03-08 DIAGNOSIS — M25522 Pain in left elbow: Secondary | ICD-10-CM

## 2024-03-08 DIAGNOSIS — W010XXA Fall on same level from slipping, tripping and stumbling without subsequent striking against object, initial encounter: Secondary | ICD-10-CM

## 2024-03-08 DIAGNOSIS — S80212A Abrasion, left knee, initial encounter: Secondary | ICD-10-CM

## 2024-03-08 DIAGNOSIS — M25562 Pain in left knee: Secondary | ICD-10-CM | POA: Diagnosis not present

## 2024-03-08 DIAGNOSIS — S50312A Abrasion of left elbow, initial encounter: Secondary | ICD-10-CM

## 2024-03-08 MED ORDER — DICLOFENAC SODIUM 50 MG PO TBEC
50.0000 mg | DELAYED_RELEASE_TABLET | Freq: Two times a day (BID) | ORAL | 0 refills | Status: AC | PRN
Start: 2024-03-08 — End: 2024-03-23

## 2024-03-08 MED ORDER — MUPIROCIN 2 % EX OINT: 1.0000 | TOPICAL_OINTMENT | Freq: Two times a day (BID) | CUTANEOUS | 0 refills | Status: AC

## 2024-03-08 NOTE — ED Provider Notes (Signed)
 [facility]    CSN: [id number] Arrival date & time: 03/08/24  0919      History   Chief Complaint Chief Complaint  Patient presents with   Abrasion   Fall    HPI Katelyn Cruz is a 63 y.o. female.   63 year old female who was running this morning and tripped over an unmarked speed bump she landed on her left knee hip and elbow.  She is able to move her arm and walk but she has significant left knee pain moderate left hip pain and mild left elbow pain.  She has abrasions on both the knee and elbow but not on the hip.  She took some acetaminophen at home.   Fall Pertinent negatives include no chest pain and no abdominal pain.    Past Medical History:  Diagnosis Date   Allergic rhinitis    Anemia    Anxiety    Arthralgia of multiple sites    Calculus of gallbladder with chronic cholecystitis without obstruction 08/27/2018   Cancer (HCC)    Left Breast cancer, 2010   Elevated LFTs 08/27/2018   Elevated liver enzymes    Epigastric pain 08/27/2018   External hemorrhoids    Hyperlipidemia    Insomnia    Low vitamin D level    Malignant neoplasm of upper-outer quadrant of left breast in female, estrogen receptor positive (HCC) 08/01/2016   Osteoporosis    Palpitations    Postoperative examination 11/12/2018   Tibialis posterior tendinitis, right 09/27/2016    Patient Active Problem List   Diagnosis Date Noted   Coronary artery calcification seen on CT scan 03/25/2021   Palpitations    Osteoporosis    Low vitamin D level    Insomnia    External hemorrhoids    Hyperlipidemia    Elevated liver enzymes    Arthralgia of multiple sites    Anxiety    Allergic rhinitis    Postoperative examination 11/12/2018   Calculus of gallbladder with chronic cholecystitis without obstruction 08/27/2018   Elevated LFTs 08/27/2018   Epigastric pain 08/27/2018   Tibialis posterior tendinitis, right 09/27/2016   Cancer (HCC) 08/31/2016   Malignant neoplasm of upper-outer  quadrant of left breast in female, estrogen receptor positive (HCC) 08/01/2016    Past Surgical History:  Procedure Laterality Date   APPENDECTOMY     CHOLECYSTECTOMY  11/2018   [facility]. Dr Bert   COLONOSCOPY  03/01/2015   Colonic polyps status post polypectomy. Internal and external hemorrhoids   COLONOSCOPY WITH PROPOFOL  09/14/2021   MASTECTOMY Left    Subtotal   PARTIAL MASTECTOMY WITH AXILLARY SENTINEL LYMPH NODE BIOPSY     TUBAL LIGATION     UMBILICAL HERNIA REPAIR     VAGINAL HYSTERECTOMY  10/24/2011    OB History   No obstetric history on file.      Home Medications    Prior to Admission medications   Medication Sig Start Date End Date Taking? Authorizing Provider  diclofenac  (VOLTAREN ) 50 MG EC tablet Take 1 tablet (50 mg total) by mouth 2 (two) times daily as needed for up to 15 days (take with food). 03/08/24 03/23/24 Yes Ival Domino, FNP  Evolocumab  (REPATHA  SURECLICK) 140 MG/ML SOAJ Inject 140 mg into the skin every 14 (fourteen) days. 06/18/23  Yes Tobb, Kardie, DO  mupirocin  ointment (BACTROBAN ) 2 % Apply 1 Application topically 2 (two) times daily. 03/08/24  Yes Ival Domino, FNP  pravastatin  (PRAVACHOL ) 20 MG tablet Take 1 tablet (20 mg  total) by mouth every evening. 06/18/23  Yes Tobb, Kardie, DO  benzonatate  (TESSALON ) 100 MG capsule Take 1 capsule (100 mg total) by mouth every 8 (eight) hours. 10/23/23   Adah Wilbert LABOR, FNP  Calcium  Carbonate-Vitamin D 600-400 MG-UNIT tablet Take 1 tablet by mouth 2 (two) times daily.    [provider]  Cholecalciferol (VITAMIN D PO) Take 1 tablet by mouth daily.     [provider]  Cyanocobalamin  (VITAMIN B-12 PO) Take 1 tablet by mouth daily.    [provider]  dicyclomine  (BENTYL ) 10 MG capsule TAKE 1 CAPSULE BY MOUTH TWICE DAILY 06/17/21   Charlanne Groom, MD  Fiber POWD Take 2 Scoops by mouth daily. Acacia [place] Tummy fiber    [provider]  venlafaxine XR (EFFEXOR-XR) 75  MG 24 hr capsule Take 75 mg by mouth daily. 08/23/20   [provider]  zaleplon (SONATA) 5 MG capsule Take 1 capsule by mouth daily. 01/14/21   [provider]    Family History Family History  Problem Relation Age of Onset   Heart disease Mother    Hypertension Mother    Breast cancer Mother    Colon cancer Neg Hx    Colon polyps Neg Hx    Esophageal cancer Neg Hx    Stomach cancer Neg Hx    Rectal cancer Neg Hx    Pancreatic cancer Neg Hx     Social History Social History   Tobacco Use   Smoking status: Never   Smokeless tobacco: Never  Vaping Use   Vaping status: Never Used  Substance Use Topics   Alcohol use: Not Currently   Drug use: Never     Allergies   Patient has no known allergies.   Review of Systems Review of Systems  Constitutional:  Negative for fever.  Respiratory:  Negative for cough.   Cardiovascular:  Negative for chest pain.  Gastrointestinal:  Negative for abdominal pain, constipation, diarrhea, nausea and vomiting.  Musculoskeletal:  Positive for arthralgias (Pain in left elbow, left knee and left hip after a fall). Negative for back pain.  Skin:  Negative for color change and rash.  Neurological:  Negative for syncope.  All other systems reviewed and are negative.    Physical Exam Triage Vital Signs ED Triage Vitals  Encounter Vitals Group     BP 03/08/24 0938 118/72     Girls Systolic BP Percentile --      Girls Diastolic BP Percentile --      Boys Systolic BP Percentile --      Boys Diastolic BP Percentile --      Pulse Rate 03/08/24 0938 67     Resp 03/08/24 0938 18     Temp 03/08/24 0938 98 F (36.7 C)     Temp Source 03/08/24 0938 Oral     SpO2 03/08/24 0938 97 %     Weight --      Height --      Head Circumference --      Peak Flow --      Pain Score 03/08/24 0937 8     Pain Loc --      Pain Education --      Exclude from Growth Chart --    No data found.  Updated Vital Signs BP 118/72 (BP  Location: Right Arm)   Pulse 67   Temp 98 F (36.7 C) (Oral)   Resp 18   SpO2 97%   Visual Acuity Right  Eye Distance:   Left Eye Distance:   Bilateral Distance:    Right Eye Near:   Left Eye Near:    Bilateral Near:     Physical Exam Vitals and nursing note reviewed.  Constitutional:      General: She is not in acute distress.    Appearance: She is well-developed. She is not ill-appearing or toxic-appearing.  HENT:     Head: Normocephalic and atraumatic.     Right Ear: External ear normal.     Left Ear: External ear normal.     Nose: Nose normal.     Mouth/Throat:     Lips: Pink.     Mouth: Mucous membranes are moist.  Eyes:     Conjunctiva/sclera: Conjunctivae normal.     Pupils: Pupils are equal, round, and reactive to light.  Cardiovascular:     Rate and Rhythm: Normal rate and regular rhythm.     Heart sounds: S1 normal and S2 normal. No murmur heard. Pulmonary:     Effort: Pulmonary effort is normal. No respiratory distress.     Breath sounds: Normal breath sounds. No decreased breath sounds, wheezing, rhonchi or rales.  Musculoskeletal:        General: No swelling.  Skin:    General: Skin is warm and dry.     Capillary Refill: Capillary refill takes less than 2 seconds.     Findings: Abrasion (See comments for information) present. No rash.     Comments: 2 abrasions on left knee with loss of tissue on top of the abrasion and some minimal bleeding.  2 abrasions on left elbow with some surrounding raw tissue.  These are fresh in the last 2 hours and there is no erythema edema nor exudate.  No induration noted.  See photos for more information.  Neurological:     Mental Status: She is alert and oriented to person, place, and time.     Gait: Gait abnormal (Mild limp on left due to pain).  Psychiatric:        Mood and Affect: Mood normal.         UC Treatments / Results  Labs (all labs ordered are listed, but only abnormal results are displayed) Comp Met  (CMET): 09/14/21:    Component Ref Range & Units (hover) 2 yr ago (09/14/21)  Sodium 138  Potassium 4.3  Chloride 100  CO2 31  Glucose, Bld 79  BUN 8  Creatinine, Ser 0.71  Total Bilirubin 0.9  Alkaline Phosphatase 61  AST 33  ALT 30  Total Protein 6.4  Albumin 4.1  GFR 93.01  Comment: Calculated using the CKD-EPI Creatinine Equation (2021)  Calcium  9.2  Resulting Agency [facility]      EKG   Radiology DG Knee Complete 4 Views Left Result Date: 03/08/2024 CLINICAL DATA:  Left knee pain after fall today. EXAM: LEFT KNEE - COMPLETE 4+ VIEW COMPARISON:  None Available. FINDINGS: No evidence of fracture, dislocation, or joint effusion. No evidence of arthropathy or other focal bone abnormality. Soft tissues are unremarkable. IMPRESSION: Negative. Electronically Signed   By: Lynwood Landy Raddle M.D.   On: 03/08/2024 10:51   DG Hip Unilat With Pelvis 2-3 Views Left Result Date: 03/08/2024 CLINICAL DATA:  Acute left hip pain after fall today. EXAM: DG HIP (WITH OR WITHOUT PELVIS) 2-3V LEFT COMPARISON:  None Available. FINDINGS: There is no evidence of hip fracture or dislocation. There is no evidence of arthropathy or other focal bone abnormality. IMPRESSION: Negative. Electronically  Signed   By: Lynwood Landy Raddle M.D.   On: 03/08/2024 10:49    Procedures Procedures (including critical care time)  Medications Ordered in UC Medications - No data to display  Initial Impression / Assessment and Plan / UC Course  I have reviewed the triage vital signs and the nursing notes.  Pertinent labs & imaging results that were available during my care of the patient were reviewed by me and considered in my medical decision making (see chart for details).  Plan of Care: Left elbow, knee and left hip pain: X-rays are negative.  Use diclofenac  50 mg twice daily if needed for pain.  Encouraged RICE therapy.  May need knee brace.  Abrasions on elbow and knee: See discharge instructions for wound  care and use mupirocin .  Follow-up if symptoms do not improve, worsen or new symptoms occur.  I reviewed the plan of care with the patient and/or the patient's guardian.  The patient and/or guardian had time to ask questions and acknowledged that the questions were answered.  I provided instruction on symptoms or reasons to return here or to go to an ER, if symptoms/condition did not improve, worsened or if new symptoms occurred.  Final Clinical Impressions(s) / UC Diagnoses   Final diagnoses:  Acute hip pain, left  Acute pain of left knee  Left elbow pain  Fall on same level from slipping, tripping or stumbling, initial encounter  Abrasion, knee, left, initial encounter  Abrasion of left elbow, initial encounter     Discharge Instructions      Left elbow, knee and hip pain: X-rays are negative.  Encouraged diclofenac  if needed for pain.  Encouraged ice, a compression sleeve and elevation if needed.  Abrasions to the left knee and elbow: Clean with warm soapy fingers, rinse, pat dry, apply mupirocin  ointment and a bandage.  May leave open to air once its dry or are crusted.  Follow-up if symptoms do not improve, worsen or new symptoms occur.     ED Prescriptions     Medication Sig Dispense Auth. Provider   mupirocin  ointment (BACTROBAN ) 2 % Apply 1 Application topically 2 (two) times daily. 22 g Ival Domino, FNP   diclofenac  (VOLTAREN ) 50 MG EC tablet Take 1 tablet (50 mg total) by mouth 2 (two) times daily as needed for up to 15 days (take with food). 30 tablet Cerena Baine, FNP      PDMP not reviewed this encounter.   Ival Domino, FNP 03/08/24 1105

## 2024-03-08 NOTE — ED Triage Notes (Signed)
 Pt reports she fell this morning on a run she has abrasion to left elbow and knee. Pt states she was running and tripped on unmarked speed bump and landed on her left side.

## 2024-03-08 NOTE — Discharge Instructions (Addendum)
 Left elbow, knee and hip pain: X-rays are negative.  Encouraged diclofenac  if needed for pain.  Encouraged ice, a compression sleeve and elevation if needed.  Abrasions to the left knee and elbow: Clean with warm soapy fingers, rinse, pat dry, apply mupirocin  ointment and a bandage.  May leave open to air once its dry or are crusted.  Follow-up if symptoms do not improve, worsen or new symptoms occur.

## 2024-06-16 ENCOUNTER — Other Ambulatory Visit: Payer: Self-pay

## 2024-06-18 MED ORDER — PRAVASTATIN SODIUM 20 MG PO TABS: 20.0000 mg | ORAL_TABLET | Freq: Every evening | ORAL | 0 refills | Status: DC

## 2024-07-01 ENCOUNTER — Other Ambulatory Visit: Payer: Self-pay | Admitting: Pharmacist Clinician (PhC)/ Clinical Pharmacy Specialist

## 2024-07-01 MED ORDER — REPATHA SURECLICK 140 MG/ML ~~LOC~~ SOAJ: SUBCUTANEOUS | 0 refills | Status: DC

## 2024-07-02 ENCOUNTER — Ambulatory Visit (HOSPITAL_BASED_OUTPATIENT_CLINIC_OR_DEPARTMENT_OTHER)

## 2024-07-07 ENCOUNTER — Other Ambulatory Visit: Payer: Self-pay

## 2024-07-07 ENCOUNTER — Telehealth: Payer: Self-pay | Admitting: Cardiology

## 2024-07-07 MED ORDER — PRAVASTATIN SODIUM 20 MG PO TABS: 20.0000 mg | ORAL_TABLET | Freq: Every evening | ORAL | 0 refills | Status: DC

## 2024-07-07 NOTE — Telephone Encounter (Signed)
" °*  STAT* If patient is at the pharmacy, call can be transferred to refill team.   1. Which medications need to be refilled? (please list name of each medication and dose if known)   pravastatin  (PRAVACHOL ) 20 MG tablet     2. Would you like to learn more about the convenience, safety, & potential cost savings by using the [facility]?   3. Are you open to using the [facility] (Type [facility].    4. Which pharmacy/location (including street and city if local pharmacy) is medication to be sent to? [facility] - [place], [place] - [address]     5. Do they need a 30 day or 90 day supply?  Refill until scheduled appt 09/09/24 "

## 2024-07-09 ENCOUNTER — Other Ambulatory Visit (HOSPITAL_COMMUNITY): Payer: Self-pay

## 2024-07-20 ENCOUNTER — Ambulatory Visit (HOSPITAL_BASED_OUTPATIENT_CLINIC_OR_DEPARTMENT_OTHER): Admit: 2024-07-20 | Discharge: 2024-07-20 | Disposition: A | Admitting: Radiology

## 2024-07-20 ENCOUNTER — Encounter (HOSPITAL_BASED_OUTPATIENT_CLINIC_OR_DEPARTMENT_OTHER): Payer: Self-pay

## 2024-07-20 ENCOUNTER — Ambulatory Visit (HOSPITAL_BASED_OUTPATIENT_CLINIC_OR_DEPARTMENT_OTHER)
Admission: RE | Admit: 2024-07-20 | Discharge: 2024-07-20 | Disposition: A | Discharge status: Home / Self Care | Attending: Family Medicine | Admitting: Family Medicine

## 2024-07-20 VITALS — BP 115/69 | HR 65 | Temp 97.9°F | Resp 20

## 2024-07-20 DIAGNOSIS — R051 Acute cough: Secondary | ICD-10-CM | POA: Diagnosis not present

## 2024-07-20 MED ORDER — TRIAMCINOLONE ACETONIDE 40 MG/ML IJ SUSP
40.0000 mg | Freq: Once | INTRAMUSCULAR | Status: AC
  Administered 2024-07-20: 40 mg via INTRAMUSCULAR

## 2024-07-20 NOTE — Discharge Instructions (Addendum)
 No concerns on your x-ray.  We gave you a Kenalog  shot here today.  This should help with lung inflammation.  Recommend using your daily inhaler and albuterol as needed.  Follow-up as needed

## 2024-07-20 NOTE — ED Triage Notes (Signed)
 Pt c/o cough for around a week. She test positive for the flu and her last day of tamiflu is today. She is concerned she is having increased chest congestion. She has taking delsym and her breo inhaler with no relief.

## 2024-07-20 NOTE — ED Provider Notes (Signed)
 " [facility]    CSN: [id number] Arrival date & time: 07/20/24  9071      History   Chief Complaint Chief Complaint  Patient presents with   Cough    Lingering cough following the flu.  Would like to make sure it hasn't turned to bronchitis or worse. - Entered by patient    HPI Katelyn Cruz is a 64 y.o. female.   Pt is a 64 year old female that presents with cough for around a week. She tested positive for the flu and her last day of tamiflu is today. She is concerned she is having increased chest congestion. Increased coughing and SOB with activity. She has taking delsym and her breo inhaler with no relief. No fever, body aches or fatigue.     Cough   Past Medical History:  Diagnosis Date   Allergic rhinitis    Anemia    Anxiety    Arthralgia of multiple sites    Calculus of gallbladder with chronic cholecystitis without obstruction 08/27/2018   Cancer (HCC)    Left Breast cancer, 2010   Elevated LFTs 08/27/2018   Elevated liver enzymes    Epigastric pain 08/27/2018   External hemorrhoids    Hyperlipidemia    Insomnia    Low vitamin D level    Malignant neoplasm of upper-outer quadrant of left breast in female, estrogen receptor positive (HCC) 08/01/2016   Osteoporosis    Palpitations    Postoperative examination 11/12/2018   Tibialis posterior tendinitis, right 09/27/2016    Patient Active Problem List   Diagnosis Date Noted   Coronary artery calcification seen on CT scan 03/25/2021   Palpitations    Osteoporosis    Low vitamin D level    Insomnia    External hemorrhoids    Hyperlipidemia    Elevated liver enzymes    Arthralgia of multiple sites    Anxiety    Allergic rhinitis    Postoperative examination 11/12/2018   Calculus of gallbladder with chronic cholecystitis without obstruction 08/27/2018   Elevated LFTs 08/27/2018   Epigastric pain 08/27/2018   Tibialis posterior tendinitis, right 09/27/2016   Cancer (HCC) 08/31/2016    Malignant neoplasm of upper-outer quadrant of left breast in female, estrogen receptor positive (HCC) 08/01/2016    Past Surgical History:  Procedure Laterality Date   APPENDECTOMY     CHOLECYSTECTOMY  11/2018   [facility]. Dr Bert   COLONOSCOPY  03/01/2015   Colonic polyps status post polypectomy. Internal and external hemorrhoids   COLONOSCOPY WITH PROPOFOL   09/14/2021   MASTECTOMY Left    Subtotal   PARTIAL MASTECTOMY WITH AXILLARY SENTINEL LYMPH NODE BIOPSY     TUBAL LIGATION     UMBILICAL HERNIA REPAIR     VAGINAL HYSTERECTOMY  10/24/2011    OB History   No obstetric history on file.      Home Medications    Prior to Admission medications  Medication Sig Start Date End Date Taking? Authorizing Provider  Calcium  Carbonate-Vitamin D 600-400 MG-UNIT tablet Take 1 tablet by mouth 2 (two) times daily.    [provider]  Cholecalciferol (VITAMIN D PO) Take 1 tablet by mouth daily.     [provider]  Cyanocobalamin  (VITAMIN B-12 PO) Take 1 tablet by mouth daily.    [provider]  Evolocumab  (REPATHA  SURECLICK) 140 MG/ML SOAJ Inject 1 pen every 14 days.  Need MD appt for further refills 07/01/24   Tobb, Kardie, DO  Fiber POWD  Take 2 Scoops by mouth daily. Acacia [place] Tummy fiber    [provider]  mupirocin  ointment (BACTROBAN ) 2 % Apply 1 Application topically 2 (two) times daily. 03/08/24   Ival Domino, FNP  pravastatin  (PRAVACHOL ) 20 MG tablet Take 1 tablet (20 mg total) by mouth every evening. 07/07/24   Tobb, Kardie, DO  venlafaxine XR (EFFEXOR-XR) 75 MG 24 hr capsule Take 75 mg by mouth daily. 08/23/20   [provider]  zaleplon (SONATA) 5 MG capsule Take 1 capsule by mouth daily. 01/14/21   [provider]    Family History Family History  Problem Relation Age of Onset   Heart disease Mother    Hypertension Mother    Breast cancer Mother    Colon cancer Neg Hx    Colon polyps Neg Hx    Esophageal  cancer Neg Hx    Stomach cancer Neg Hx    Rectal cancer Neg Hx    Pancreatic cancer Neg Hx     Social History Social History[1]   Allergies   Patient has no known allergies.   Review of Systems Review of Systems  Respiratory:  Positive for cough.      Physical Exam Triage Vital Signs ED Triage Vitals  Encounter Vitals Group     BP 07/20/24 0944 115/69     Girls Systolic BP Percentile --      Girls Diastolic BP Percentile --      Boys Systolic BP Percentile --      Boys Diastolic BP Percentile --      Pulse Rate 07/20/24 0944 65     Resp 07/20/24 0944 20     Temp 07/20/24 0944 97.9 F (36.6 C)     Temp Source 07/20/24 0944 Oral     SpO2 07/20/24 0944 97 %     Weight --      Height --      Head Circumference --      Peak Flow --      Pain Score 07/20/24 0942 0     Pain Loc --      Pain Education --      Exclude from Growth Chart --    No data found.  Updated Vital Signs BP 115/69 (BP Location: Right Arm)   Pulse 65   Temp 97.9 F (36.6 C) (Oral)   Resp 20   SpO2 97%   Visual Acuity Right Eye Distance:   Left Eye Distance:   Bilateral Distance:    Right Eye Near:   Left Eye Near:    Bilateral Near:     Physical Exam Vitals and nursing note reviewed.  Constitutional:      General: She is not in acute distress.    Appearance: Normal appearance. She is not ill-appearing, toxic-appearing or diaphoretic.  Cardiovascular:     Rate and Rhythm: Normal rate and regular rhythm.  Pulmonary:     Effort: Pulmonary effort is normal.     Comments: Mild wheeze and rale to RLL Neurological:     Mental Status: She is alert.  Psychiatric:        Mood and Affect: Mood normal.      UC Treatments / Results  Labs (all labs ordered are listed, but only abnormal results are displayed) Labs Reviewed - No data to display  EKG   Radiology DG Chest 2 View Result Date: 07/20/2024 EXAM: 2 VIEW(S) XRAY OF THE CHEST 07/20/2024 10:09:00 AM COMPARISON: 08/04/2018  CLINICAL HISTORY: cough FINDINGS:  LUNGS AND PLEURA: No focal pulmonary opacity. No pleural effusion. No pneumothorax. HEART AND MEDIASTINUM: No acute abnormality of the cardiac and mediastinal silhouettes. BONES AND SOFT TISSUES: No acute osseous abnormality. Cholecystectomy clips noted. IMPRESSION: 1. No acute process. Electronically signed by: Waddell Calk MD 07/20/2024 10:36 AM EST RP Workstation: [id number]    Procedures Procedures (including critical care time)  Medications Ordered in UC Medications  triamcinolone  acetonide (KENALOG -40) injection 40 mg (40 mg Intramuscular Given 07/20/24 1045)    Initial Impression / Assessment and Plan / UC Course  I have reviewed the triage vital signs and the nursing notes.  Pertinent labs & imaging results that were available during my care of the patient were reviewed by me and considered in my medical decision making (see chart for details).     Acute cough-no concerns on x-ray for pneumonia.  Most likely some bronchial inflammation.  Steroid injection given here today.  Recommend using daily inhaler and albuterol as needed for cough, wheezing or shortness of breath. Follow-up as needed Final Clinical Impressions(s) / UC Diagnoses   Final diagnoses:  Acute cough     Discharge Instructions      No concerns on your x-ray.  We gave you a Kenalog  shot here today.  This should help with lung inflammation.  Recommend using your daily inhaler and albuterol as needed.  Follow-up as needed     ED Prescriptions   None    PDMP not reviewed this encounter.     [1]  Social History Tobacco Use   Smoking status: Never   Smokeless tobacco: Never  Vaping Use   Vaping status: Never Used  Substance Use Topics   Alcohol use: Not Currently   Drug use: Never     Adah Wilbert LABOR, FNP 07/20/24 1108  "

## 2024-07-22 ENCOUNTER — Encounter: Payer: Self-pay | Admitting: Cardiology

## 2024-07-22 ENCOUNTER — Ambulatory Visit: Attending: Cardiology | Admitting: Cardiology

## 2024-07-22 VITALS — BP 120/68 | HR 61 | Ht 61.0 in | Wt 105.2 lb

## 2024-07-22 DIAGNOSIS — R002 Palpitations: Secondary | ICD-10-CM

## 2024-07-22 DIAGNOSIS — E782 Mixed hyperlipidemia: Secondary | ICD-10-CM | POA: Diagnosis not present

## 2024-07-22 MED ORDER — REPATHA SURECLICK 140 MG/ML ~~LOC~~ SOAJ: SUBCUTANEOUS | 4 refills | Status: AC

## 2024-07-22 MED ORDER — PRAVASTATIN SODIUM 20 MG PO TABS: 20.0000 mg | ORAL_TABLET | Freq: Every evening | ORAL | 4 refills | Status: AC

## 2024-07-22 NOTE — Progress Notes (Signed)
 " Cardiology Office Note:    Date:  07/22/2024   ID:  Katelyn Cruz, DOB 1960/09/15, MRN [id number]  PCP:  Pandora Therisa RAMAN, NP  Cardiologist:  Dub Huntsman, DO  Electrophysiologist:  None   Referring MD: Pandora Therisa RAMAN, NP      History of Present Illness:    Katelyn Cruz is a 64 y.o. female with a hx of coronary artery calcification, vitamin D deficiency, familial hyperlipidemia, insomnia, history of breast cancer here today for follow-up visit.     At her last visit she was doing well from a cardiovascular standpoint.  I ordered her Repatha .  She is here for follow-up visit.  She is recovering from a cold.  She was seen in the urgent care recently on January 18 and is doing much better.  No other complaints at this time.   Past Medical History:  Diagnosis Date   Allergic rhinitis    Anemia    Anxiety    Arthralgia of multiple sites    Calculus of gallbladder with chronic cholecystitis without obstruction 08/27/2018   Cancer (HCC)    Left Breast cancer, 2010   Elevated LFTs 08/27/2018   Elevated liver enzymes    Epigastric pain 08/27/2018   External hemorrhoids    Hyperlipidemia    Insomnia    Low vitamin D level    Malignant neoplasm of upper-outer quadrant of left breast in female, estrogen receptor positive (HCC) 08/01/2016   Osteoporosis    Palpitations    Postoperative examination 11/12/2018   Tibialis posterior tendinitis, right 09/27/2016    Past Surgical History:  Procedure Laterality Date   APPENDECTOMY     CHOLECYSTECTOMY  11/2018   [facility]. Dr Bert   COLONOSCOPY  03/01/2015   Colonic polyps status post polypectomy. Internal and external hemorrhoids   COLONOSCOPY WITH PROPOFOL   09/14/2021   MASTECTOMY Left    Sub[employer]   PARTIAL MASTECTOMY WITH AXILLARY SENTINEL LYMPH NODE BIOPSY     TUBAL LIGATION     UMBILICAL HERNIA REPAIR     VAGINAL HYSTERECTOMY  10/24/2011    Current Medications: Current Meds  Medication Sig   Calcium   Carbonate-Vitamin D 600-400 MG-UNIT tablet Take 1 tablet by mouth 2 (two) times daily.   Cholecalciferol (VITAMIN D PO) Take 1 tablet by mouth daily.    Cyanocobalamin  (VITAMIN B-12 PO) Take 1 tablet by mouth daily.   Evolocumab  (REPATHA  SURECLICK) 140 MG/ML SOAJ Inject 1 pen every 14 days.  Need MD appt for further refills   Fiber POWD Take 2 Scoops by mouth daily. Acacia [place] Tummy fiber   mupirocin  ointment (BACTROBAN ) 2 % Apply 1 Application topically 2 (two) times daily.   pravastatin  (PRAVACHOL ) 20 MG tablet Take 1 tablet (20 mg total) by mouth every evening.   venlafaxine XR (EFFEXOR-XR) 75 MG 24 hr capsule Take 75 mg by mouth daily.   zaleplon (SONATA) 5 MG capsule Take 1 capsule by mouth daily.     Allergies:   Patient has no known allergies.   Social History   Socioeconomic History   Marital status: Married    Spouse name: Not on file   Number of children: Not on file   Years of education: Not on file   Highest education level: Not on file  Occupational History   Not on file  Tobacco Use   Smoking status: Never   Smokeless tobacco: Never  Vaping Use   Vaping status: Never Used  Substance and Sexual Activity   Alcohol  use: Not Currently   Drug use: Never   Sexual activity: Not on file  Other Topics Concern   Not on file  Social History Narrative   Not on file   Social Drivers of Health   Tobacco Use: Low Risk (07/22/2024)   Patient History    Smoking Tobacco Use: Never    Smokeless Tobacco Use: Never    Passive Exposure: Not on file  Financial Resource Strain: Not on file  Food Insecurity: Not on file  Transportation Needs: Not on file  Physical Activity: Not on file  Stress: Not on file  Social Connections: Not on file  Depression (EYV7-0): Not on file  Alcohol Screen: Not on file  Housing: Not on file  Utilities: Not on file  Health Literacy: Not on file     Family History: The patient's family history includes Breast cancer in her mother; Heart  disease in her mother; Hypertension in her mother. There is no history of Colon cancer, Colon polyps, Esophageal cancer, Stomach cancer, Rectal cancer, or Pancreatic cancer.  ROS:   Review of Systems  Constitution: Negative for decreased appetite, fever and weight gain.  HENT: Negative for congestion, ear discharge, hoarse voice and sore throat.   Eyes: Negative for discharge, redness, vision loss in right eye and visual halos.  Cardiovascular: Negative for chest pain, dyspnea on exertion, leg swelling, orthopnea and palpitations.  Respiratory: Negative for cough, hemoptysis, shortness of breath and snoring.   Endocrine: Negative for heat intolerance and polyphagia.  Hematologic/Lymphatic: Negative for bleeding problem. Does not bruise/bleed easily.  Skin: Negative for flushing, nail changes, rash and suspicious lesions.  Musculoskeletal: Negative for arthritis, joint pain, muscle cramps, myalgias, neck pain and stiffness.  Gastrointestinal: Negative for abdominal pain, bowel incontinence, diarrhea and excessive appetite.  Genitourinary: Negative for decreased libido, genital sores and incomplete emptying.  Neurological: Negative for brief paralysis, focal weakness, headaches and loss of balance.  Psychiatric/Behavioral: Negative for altered mental status, depression and suicidal ideas.  Allergic/Immunologic: Negative for HIV exposure and persistent infections.    EKGs/Labs/Other Studies Reviewed:    The following studies were reviewed today:   EKG:  The ekg ordered today demonstrates sinus rhythm, heart rate 61 bpm with P wave morphology suggesting left atrial enlargement.  When compared to prior ECG no significant change.  Recent Labs: No results found for requested labs within last 365 days.  Recent Lipid Panel    Component Value Date/Time   CHOL 113 09/06/2021 0805   TRIG 59 09/06/2021 0805   HDL 69 09/06/2021 0805   CHOLHDL 1.6 09/06/2021 0805   LDLCALC 31 09/06/2021 0805     Physical Exam:    VS:  BP 120/68 (BP Location: Right Arm, Patient Position: Sitting, Cuff Size: Normal)   Pulse 61   Ht 5' 1 (1.549 m)   Wt 105 lb 3.2 oz (47.7 kg)   SpO2 94%   BMI 19.88 kg/m     Wt Readings from Last 3 Encounters:  07/22/24 105 lb 3.2 oz (47.7 kg)  06/18/23 108 lb 9.6 oz (49.3 kg)  06/16/22 108 lb (49 kg)     GEN: Well nourished, well developed in no acute distress HEENT: Normal NECK: No JVD; No carotid bruits LYMPHATICS: No lymphadenopathy CARDIAC: S1S2 noted,RRR, no murmurs, rubs, gallops RESPIRATORY:  Clear to auscultation without rales, wheezing or rhonchi  ABDOMEN: Soft, non-tender, non-distended, +bowel sounds, no guarding. EXTREMITIES: No edema, No cyanosis, no clubbing MUSCULOSKELETAL:  No deformity  SKIN: Warm and dry NEUROLOGIC:  Alert and oriented x 3, non-focal PSYCHIATRIC:  Normal affect, good insight  ASSESSMENT:    1. Palpitations   2. Mixed hyperlipidemia    PLAN:    She is doing well from a clinical standpoint.  Lab work was done by her PCP in May 2025 show evidence of total cholesterol 120, HDL 78, LDL 30, triglyceride 54.   Will send refills on her Repatha  and pravastatin .    The patient is in agreement with the above plan. The patient left the office in stable condition.  The patient will follow up in 1 year.   Medication Adjustments/Labs and Tests Ordered: Current medicines are reviewed at length with the patient today.  Concerns regarding medicines are outlined above.  Orders Placed This Encounter  Procedures   EKG 12-Lead   No orders of the defined types were placed in this encounter.   There are no Patient Instructions on file for this visit.   Adopting a Healthy Lifestyle.  Know what a healthy weight is for you (roughly BMI <25) and aim to maintain this   Aim for 7+ servings of fruits and vegetables daily   65-80+ fluid ounces of water or unsweet tea for healthy kidneys   Limit to max 1 drink of alcohol  per day; avoid smoking/tobacco   Limit animal fats in diet for cholesterol and heart health - choose grass fed whenever available   Avoid highly processed foods, and foods high in saturated/trans fats   Aim for low stress - take time to unwind and care for your mental health   Aim for 150 min of moderate intensity exercise weekly for heart health, and weights twice weekly for bone health   Aim for 7-9 hours of sleep daily   When it comes to diets, agreement about the perfect plan isnt easy to find, even among the experts. Experts at the [facility] [employer] developed an idea known as the Healthy Eating Plate. Just imagine a plate divided into logical, healthy portions.   The emphasis is on diet quality:   Load up on vegetables and fruits - one-half of your plate: Aim for color and variety, and remember that potatoes dont count.   Go for whole grains - one-quarter of your plate: Whole wheat, barley, wheat berries, quinoa, oats, brown rice, and foods made with them. If you want pasta, go with whole wheat pasta.   Protein power - one-quarter of your plate: Fish, chicken, beans, and nuts are all healthy, versatile protein sources. Limit red meat.   The diet, however, does go beyond the plate, offering a few other suggestions.   Use healthy plant oils, such as olive, canola, soy, corn, sunflower and peanut. Check the labels, and avoid partially hydrogenated oil, which have unhealthy trans fats.   If youre thirsty, drink water. Coffee and tea are good in moderation, but skip sugary drinks and limit milk and dairy products to one or two daily servings.   The type of carbohydrate in the diet is more important than the amount. Some sources of carbohydrates, such as vegetables, fruits, whole grains, and beans-are healthier than others.   Finally, stay active  Signed, Dub Huntsman, DO  07/22/2024 8:35 AM    [facility] "

## 2024-07-22 NOTE — Addendum Note (Signed)
 Addended by: MELIDA ROLIN HERO on: 07/22/2024 08:39 AM   Modules accepted: Orders

## 2024-07-22 NOTE — Patient Instructions (Signed)
 Medication Instructions:  Your physician recommends that you continue on your current medications as directed. Please refer to the Current Medication list given to you today.  *If you need a refill on your cardiac medications before your next appointment, please call your pharmacy*   Follow-Up: At [facility], you and your health needs are our priority.  As part of our continuing mission to provide you with exceptional heart care, our providers are all part of one team.  This team includes your primary Cardiologist (physician) and Advanced Practice Providers or APPs (Physician Assistants and Nurse Practitioners) who all work together to provide you with the care you need, when you need it.  Your next appointment:   1 year(s) please contact the office in July/August to set up appointment   Provider:   Kardie Tobb, DO     Other Instructions:

## 2024-09-09 ENCOUNTER — Ambulatory Visit: Admitting: Cardiology
